# Patient Record
Sex: Female | Born: 1995 | Race: White | Hispanic: No | Smoking: Never smoker
Health system: Southern US, Community
[De-identification: ages and names within clinical notes are randomized; demographics above are authoritative.]

## PROBLEM LIST (undated history)

## (undated) ENCOUNTER — Emergency Department (HOSPITAL_COMMUNITY): Payer: Self-pay | Source: Home / Self Care

## (undated) ENCOUNTER — Ambulatory Visit (HOSPITAL_COMMUNITY): Disposition: A | Discharge status: Home / Self Care | Payer: BLUE CROSS/BLUE SHIELD

## (undated) DIAGNOSIS — E282 Polycystic ovarian syndrome: Secondary | ICD-10-CM

## (undated) DIAGNOSIS — T7840XA Allergy, unspecified, initial encounter: Secondary | ICD-10-CM

## (undated) DIAGNOSIS — Z789 Other specified health status: Secondary | ICD-10-CM

## (undated) DIAGNOSIS — B977 Papillomavirus as the cause of diseases classified elsewhere: Secondary | ICD-10-CM

## (undated) HISTORY — PX: NO PAST SURGERIES: SHX2092

## (undated) HISTORY — DX: Polycystic ovarian syndrome: E28.2

## (undated) HISTORY — DX: Papillomavirus as the cause of diseases classified elsewhere: B97.7

## (undated) HISTORY — DX: Allergy, unspecified, initial encounter: T78.40XA

## (undated) HISTORY — PX: CERVICAL BIOPSY: SHX590

---
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
## 2016-01-31 ENCOUNTER — Inpatient Hospital Stay (HOSPITAL_COMMUNITY): Admission: AD | Admit: 2016-01-31 | Payer: Self-pay | Source: Ambulatory Visit | Admitting: Obstetrics and Gynecology

## 2017-04-10 ENCOUNTER — Ambulatory Visit (HOSPITAL_COMMUNITY)
Admission: EM | Admit: 2017-04-10 | Discharge: 2017-04-10 | Disposition: A | Discharge status: Home / Self Care | Payer: BLUE CROSS/BLUE SHIELD | Attending: Emergency Medicine | Admitting: Emergency Medicine

## 2017-04-10 ENCOUNTER — Encounter (HOSPITAL_COMMUNITY): Payer: Self-pay | Admitting: *Deleted

## 2017-04-10 DIAGNOSIS — E282 Polycystic ovarian syndrome: Secondary | ICD-10-CM | POA: Insufficient documentation

## 2017-04-10 DIAGNOSIS — A630 Anogenital (venereal) warts: Secondary | ICD-10-CM | POA: Insufficient documentation

## 2017-04-10 DIAGNOSIS — J00 Acute nasopharyngitis [common cold]: Secondary | ICD-10-CM | POA: Diagnosis not present

## 2017-04-10 DIAGNOSIS — R509 Fever, unspecified: Secondary | ICD-10-CM | POA: Diagnosis present

## 2017-04-10 DIAGNOSIS — J029 Acute pharyngitis, unspecified: Secondary | ICD-10-CM | POA: Diagnosis present

## 2017-04-10 DIAGNOSIS — R0981 Nasal congestion: Secondary | ICD-10-CM | POA: Diagnosis present

## 2017-04-10 LAB — POCT RAPID STREP A: Streptococcus, Group A Screen (Direct): NEGATIVE

## 2017-04-10 MED ORDER — PHENOL 1.4 % MT LIQD: 1.0000 | OROMUCOSAL | 0 refills | Status: DC | PRN

## 2017-04-10 MED ORDER — FLUTICASONE PROPIONATE 50 MCG/ACT NA SUSP: 2.0000 | Freq: Every day | NASAL | 0 refills | Status: DC

## 2017-04-10 NOTE — ED Provider Notes (Signed)
[facility]    CSN: [id number] Arrival date & time: 04/10/17  1129     History   Chief Complaint Chief Complaint  Patient presents with  . Sore Throat  . Fever  . Nasal Congestion    HPI Kayla Palmer is a 21 y.o. female.   21 year old female comes in for sore throat and nasal congestion. She states she had one episode of similar symptoms that lasted for the past week, which cleared up for 2 days, recent episode started yesterday. She has had subjective fever, with chills. No nasal drainage with productive cough. Denies ear pain, eye pain. Patient states she has seasonal allergies, and takes Claritin-D on and off, which she has started daily since the onset of symptoms. She has taken ibuprofen with good relief. Feels like her throat is swelling, denies trouble breathing, trouble swallowing, shortness of breath, chest pain. Denies sick contact.      Past Medical History:  Diagnosis Date  . Allergy   . HPV (human papilloma virus) infection   . PCOS (polycystic ovarian syndrome)     There are no active problems to display for this patient.   Past Surgical History:  Procedure Laterality Date  . CERVICAL BIOPSY      OB History    No data available       Home Medications    Prior to Admission medications   Medication Sig Start Date End Date Taking? Authorizing Provider  levonorgestrel (MIRENA) 20 MCG/24HR IUD 1 each by Intrauterine route once.   Yes [provider]  ondansetron (ZOFRAN-ODT) 8 MG disintegrating tablet Take 1 tablet (8 mg total) by mouth every 8 (eight) hours as needed for nausea. 04/18/15  Yes Carmelina DaneAnderson, Jeffery S, MD  desloratadine-pseudoephedrine (CLARINEX-D 12-HOUR) 2.5-120 MG per tablet Take 1 tablet by mouth 2 (two) times daily.    [provider]  fluticasone (FLONASE) 50 MCG/ACT nasal spray Place 2 sprays into both nostrils daily. 04/10/17   Cathie HoopsYu, Dreonna Hussein V, PA-C  phenol (CHLORASEPTIC) 1.4 % LIQD Use as directed 1 spray in  the mouth or throat as needed for throat irritation / pain. 04/10/17   Belinda FisherYu, [place] Frieden V, PA-C    Family History Family History  Problem Relation Age of Onset  . Hyperlipidemia Mother   . Hyperlipidemia Father   . Hyperlipidemia Sister   . Hyperlipidemia Maternal Grandmother   . Stroke Maternal Grandmother   . Mental illness Maternal Grandmother   . Hyperlipidemia Maternal Grandfather     Social History Social History  Substance Use Topics  . Smoking status: Never Smoker  . Smokeless tobacco: Never Used  . Alcohol use No     Allergies   Other   Review of Systems Review of Systems  Reason unable to perform ROS: See HPI as above.     Physical Exam Triage Vital Signs ED Triage Vitals  Enc Vitals Group     BP 04/10/17 1210 110/75     Pulse Rate 04/10/17 1210 (!) 104     Resp 04/10/17 1210 16     Temp 04/10/17 1210 99.6 F (37.6 C)     Temp Source 04/10/17 1210 Oral     SpO2 04/10/17 1210 100 %     Weight --      Height --      Head Circumference --      Peak Flow --      Pain Score 04/10/17 1208 8     Pain Loc --  Pain Edu? --      Excl. in GC? --    No data found.   Updated Vital Signs BP 110/75 (BP Location: Left Arm)   Pulse (!) 104   Temp 99.6 F (37.6 C) (Oral)   Resp 16   SpO2 100%   Physical Exam  Constitutional: She is oriented to person, place, and time. She appears well-developed and well-nourished. No distress.  HENT:  Head: Normocephalic and atraumatic.  Right Ear: External ear normal.  Left Ear: Tympanic membrane, external ear and ear canal normal. Tympanic membrane is not erythematous and not bulging.  Nose: Mucosal edema and rhinorrhea present. Right sinus exhibits no maxillary sinus tenderness and no frontal sinus tenderness. Left sinus exhibits no maxillary sinus tenderness and no frontal sinus tenderness.  Mouth/Throat: Uvula is midline and mucous membranes are normal. Posterior oropharyngeal erythema present. Tonsils are 1+ on the  right. Tonsils are 1+ on the left. No tonsillar exudate.  Right ear cerumen impaction, TM not visible.   Eyes: Pupils are equal, round, and reactive to light. Conjunctivae are normal.  Neck: Normal range of motion. Neck supple.  Cardiovascular: Normal rate, regular rhythm and normal heart sounds.  Exam reveals no gallop and no friction rub.   No murmur heard. Pulmonary/Chest: Effort normal and breath sounds normal. She has no decreased breath sounds. She has no wheezes. She has no rhonchi. She has no rales.  Lymphadenopathy:    She has no cervical adenopathy.  Neurological: She is alert and oriented to person, place, and time.  Skin: Skin is warm and dry.  Psychiatric: She has a normal mood and affect. Her behavior is normal. Judgment normal.     UC Treatments / Results  Labs (all labs ordered are listed, but only abnormal results are displayed) Labs Reviewed  CULTURE, GROUP A STREP [facility])  POCT RAPID STREP A    EKG  EKG Interpretation None       Radiology No results found.  Procedures Procedures (including critical care time)  Medications Ordered in UC Medications - No data to display   Initial Impression / Assessment and Plan / UC Course  I have reviewed the triage vital signs and the nursing notes.  Pertinent labs & imaging results that were available during my care of the patient were reviewed by me and considered in my medical decision making (see chart for details).    Rapid strep negative. Symptomatic treatment as needed. Push fluids. Return precautions given.    Final Clinical Impressions(s) / UC Diagnoses   Final diagnoses:  Acute nasopharyngitis    New Prescriptions Discharge Medication List as of 04/10/2017 12:39 PM    START taking these medications   Details  fluticasone (FLONASE) 50 MCG/ACT nasal spray Place 2 sprays into both nostrils daily., Starting Fri 04/10/2017, Normal    phenol (CHLORASEPTIC) 1.4 % LIQD Use as directed 1 spray in the mouth  or throat as needed for throat irritation / pain., Starting Fri 04/10/2017, Normal          Orenthal Debski V, PA-C 04/10/17 1521

## 2017-04-10 NOTE — Discharge Instructions (Signed)
Rapid strep negative. Symptoms are most likely due to viral illness. Start phenol for sore throat. Flonase and/or Claritin-D for nasal congestion. You can use over the counter nasal saline rinse such as neti pot for nasal congestion. Keep hydrated, you urine should be clear to pale yellow in color. Monitor for any worsening of symptoms, swelling of the throat, trouble breathing, trouble swallowing, follow up for reevaluation.

## 2017-04-10 NOTE — ED Triage Notes (Signed)
Patient reports sore throat, congestion, and nasal drainage x 1 week. Reports intermittent fevers.

## 2017-04-11 ENCOUNTER — Telehealth (HOSPITAL_COMMUNITY): Payer: Self-pay | Admitting: Internal Medicine

## 2017-04-11 LAB — CULTURE, GROUP A STREP (THRC)

## 2017-04-11 MED ORDER — PENICILLIN V POTASSIUM 500 MG PO TABS: 500.0000 mg | ORAL_TABLET | Freq: Two times a day (BID) | ORAL | 0 refills | Status: DC

## 2017-04-11 NOTE — Telephone Encounter (Signed)
Clinical staff, please let patient know that throat culture was positive for non group A Strep.  Rx penicillin V sent to the pharmacy of record, [facility] on [address].  Recheck for further evaluation if symptoms are not improving.  LM

## 2017-04-30 ENCOUNTER — Ambulatory Visit (HOSPITAL_COMMUNITY)
Admission: EM | Admit: 2017-04-30 | Discharge: 2017-04-30 | Disposition: A | Discharge status: Home / Self Care | Payer: BLUE CROSS/BLUE SHIELD | Attending: Family Medicine | Admitting: Family Medicine

## 2017-04-30 ENCOUNTER — Encounter (HOSPITAL_COMMUNITY): Payer: Self-pay | Admitting: Emergency Medicine

## 2017-04-30 DIAGNOSIS — K2900 Acute gastritis without bleeding: Secondary | ICD-10-CM | POA: Diagnosis not present

## 2017-04-30 DIAGNOSIS — I951 Orthostatic hypotension: Secondary | ICD-10-CM | POA: Diagnosis not present

## 2017-04-30 DIAGNOSIS — E860 Dehydration: Secondary | ICD-10-CM | POA: Diagnosis not present

## 2017-04-30 DIAGNOSIS — R112 Nausea with vomiting, unspecified: Secondary | ICD-10-CM | POA: Diagnosis not present

## 2017-04-30 MED ORDER — SODIUM CHLORIDE 0.9 % IV SOLN
Freq: Once | INTRAVENOUS | Status: AC
  Administered 2017-04-30: 17:00:00 via INTRAVENOUS

## 2017-04-30 MED ORDER — ONDANSETRON HCL 4 MG/2ML IJ SOLN
INTRAMUSCULAR | Status: AC
  Filled 2017-04-30: qty 2

## 2017-04-30 MED ORDER — ONDANSETRON 4 MG PO TBDP: ORAL_TABLET | ORAL | 0 refills | Status: DC

## 2017-04-30 MED ORDER — ONDANSETRON HCL 4 MG/2ML IJ SOLN
4.0000 mg | Freq: Once | INTRAMUSCULAR | Status: AC
  Administered 2017-04-30: 4 mg via INTRAVENOUS

## 2017-04-30 MED ORDER — ONDANSETRON HCL 4 MG/2ML IJ SOLN: 8.0000 mg | Freq: Once | INTRAMUSCULAR | Status: DC

## 2017-04-30 NOTE — Discharge Instructions (Signed)
Take Zofran sublingually as directed. Start drinking clear liquids in small frequent amounts. consider using Pepcid to help reduce stomach acid. This will take a couple of days or so for you to get back on your feet. If she continued to have vomiting which is unrelieved or develop diarrhea as well plus fever and getting worse he may need to go to the emergency department.

## 2017-04-30 NOTE — ED Triage Notes (Addendum)
Pt woke up at 0800 this morning and began projectile vomiting.  She reports body aches and chills and vomiting all day.  She has not been able to keep anything down including fluids all day today.    She states she is vomiting so hard that she is "fading in and out".  Pt tried Zofran and Ibuprofen this morning and states she vomited up all of her medications.  Pt was at a club last night until 0300 where she states she had only one shot of liquor and smoked marijuana.  She also states she ate at [employer] on her way home last night.
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
## 2017-05-04 ENCOUNTER — Encounter: Payer: Self-pay | Admitting: Internal Medicine

## 2017-05-11 ENCOUNTER — Emergency Department (HOSPITAL_COMMUNITY): Payer: BLUE CROSS/BLUE SHIELD

## 2017-05-11 ENCOUNTER — Emergency Department (HOSPITAL_COMMUNITY)
Admission: EM | Admit: 2017-05-11 | Discharge: 2017-05-12 | Disposition: A | Discharge status: Home / Self Care | Payer: BLUE CROSS/BLUE SHIELD | Attending: Emergency Medicine | Admitting: Emergency Medicine

## 2017-05-11 ENCOUNTER — Encounter (HOSPITAL_COMMUNITY): Payer: Self-pay | Admitting: Emergency Medicine

## 2017-05-11 DIAGNOSIS — R079 Chest pain, unspecified: Secondary | ICD-10-CM | POA: Diagnosis present

## 2017-05-11 DIAGNOSIS — Z5321 Procedure and treatment not carried out due to patient leaving prior to being seen by health care provider: Secondary | ICD-10-CM | POA: Insufficient documentation

## 2017-05-11 LAB — BASIC METABOLIC PANEL
Anion gap: 8 (ref 5–15)
BUN: 17 mg/dL (ref 6–20)
CALCIUM: 9.4 mg/dL (ref 8.9–10.3)
CO2: 25 mmol/L (ref 22–32)
CREATININE: 0.68 mg/dL (ref 0.44–1.00)
Chloride: 106 mmol/L (ref 101–111)
GFR calc non Af Amer: >60 mL/min (ref >60)
GLUCOSE: 95 mg/dL (ref 65–99)
Potassium: 3.5 mmol/L (ref 3.5–5.1)
Sodium: 139 mmol/L (ref 135–145)

## 2017-05-11 LAB — CBC
HCT: 39.9 % (ref 36.0–46.0)
Hemoglobin: 13.6 g/dL (ref 12.0–15.0)
MCH: 29.3 pg (ref 26.0–34.0)
MCHC: 34.1 g/dL (ref 30.0–36.0)
MCV: 86 fL (ref 78.0–100.0)
PLATELETS: 226 10*3/uL (ref 150–400)
RBC: 4.64 MIL/uL (ref 3.87–5.11)
RDW: 13 % (ref 11.5–15.5)
WBC: 11.3 10*3/uL — ABNORMAL HIGH (ref 4.0–10.5)

## 2017-05-11 LAB — I-STAT BETA HCG BLOOD, ED (MC, WL, AP ONLY): I-stat hCG, quantitative: <5 m[IU]/mL (ref <5)

## 2017-05-11 LAB — I-STAT TROPONIN, ED: TROPONIN I, POC: 0.01 ng/mL (ref 0.00–0.08)
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
## 2017-05-12 MED ORDER — IBUPROFEN 400 MG PO TABS
ORAL_TABLET | ORAL | Status: DC
Start: 2017-05-12 — End: 2017-05-12
  Filled 2017-05-12: qty 1

## 2017-05-12 MED ORDER — IBUPROFEN 400 MG PO TABS
400.0000 mg | ORAL_TABLET | Freq: Once | ORAL | Status: AC | PRN
  Administered 2017-05-12: 400 mg via ORAL

## 2017-05-12 NOTE — ED Notes (Signed)
Pt up to nurse first stating she cannot wait any longer, ambulatory out of department.

## 2017-05-15 ENCOUNTER — Ambulatory Visit (HOSPITAL_COMMUNITY)
Admission: EM | Admit: 2017-05-15 | Discharge: 2017-05-15 | Disposition: A | Discharge status: Home / Self Care | Payer: BLUE CROSS/BLUE SHIELD | Attending: Family Medicine | Admitting: Family Medicine

## 2017-05-15 ENCOUNTER — Ambulatory Visit (INDEPENDENT_AMBULATORY_CARE_PROVIDER_SITE_OTHER): Payer: Self-pay

## 2017-05-15 ENCOUNTER — Encounter (HOSPITAL_COMMUNITY): Payer: Self-pay

## 2017-05-15 DIAGNOSIS — S20219A Contusion of unspecified front wall of thorax, initial encounter: Secondary | ICD-10-CM | POA: Diagnosis not present

## 2017-05-15 DIAGNOSIS — S39012A Strain of muscle, fascia and tendon of lower back, initial encounter: Secondary | ICD-10-CM | POA: Diagnosis not present

## 2017-05-15 MED ORDER — NAPROXEN 375 MG PO TABS: 375.0000 mg | ORAL_TABLET | Freq: Two times a day (BID) | ORAL | 0 refills | Status: DC

## 2017-05-15 NOTE — Discharge Instructions (Signed)
Ice to the front of the chest off and on for the next with 3 days. Heat to the low back. Take the anti-inflammatory medicine naproxen twice a day with food. He will be sore for the next several days. Should gradually get better. Limit the movements that tend to make it worse. X-rays negative for fractures or other abnormalities.
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
## 2017-06-10 ENCOUNTER — Ambulatory Visit: Payer: BLUE CROSS/BLUE SHIELD | Admitting: Internal Medicine

## 2017-06-21 ENCOUNTER — Ambulatory Visit (HOSPITAL_COMMUNITY)
Admission: EM | Admit: 2017-06-21 | Discharge: 2017-06-21 | Disposition: A | Discharge status: Home / Self Care | Payer: BLUE CROSS/BLUE SHIELD | Attending: Family Medicine | Admitting: Family Medicine

## 2017-06-21 ENCOUNTER — Encounter (HOSPITAL_COMMUNITY): Payer: Self-pay | Admitting: *Deleted

## 2017-06-21 DIAGNOSIS — R69 Illness, unspecified: Secondary | ICD-10-CM | POA: Diagnosis not present

## 2017-06-21 DIAGNOSIS — J111 Influenza due to unidentified influenza virus with other respiratory manifestations: Secondary | ICD-10-CM

## 2017-06-21 MED ORDER — ACETAMINOPHEN 325 MG PO TABS
ORAL_TABLET | ORAL | Status: AC
  Filled 2017-06-21: qty 2

## 2017-06-21 MED ORDER — ACETAMINOPHEN 325 MG PO TABS
650.0000 mg | ORAL_TABLET | Freq: Once | ORAL | Status: AC
  Administered 2017-06-21: 650 mg via ORAL

## 2017-06-21 MED ORDER — OSELTAMIVIR PHOSPHATE 75 MG PO CAPS: 75.0000 mg | ORAL_CAPSULE | Freq: Two times a day (BID) | ORAL | 0 refills | Status: DC

## 2017-06-21 MED ORDER — HYDROCODONE-HOMATROPINE 5-1.5 MG/5ML PO SYRP: 5.0000 mL | ORAL_SOLUTION | Freq: Four times a day (QID) | ORAL | 0 refills | Status: DC | PRN

## 2017-06-21 NOTE — ED Provider Notes (Signed)
[facility]   [id number] 06/21/17 Arrival Time: 1210   SUBJECTIVE:  Loralyn FreshwaterFrancesca Boda is a 21 y.o. female who presents to the urgent care with complaint of not feeling well since 2 days ago; symptoms started yesterday with congestion, "a lot of mucus sitting in my throat making me nauseous", chills.  Has not taken any meds today.  Patient has remote h/o asthma as child  She works in a bar as a Production assistant, radioserver.   Past Medical History:  Diagnosis Date  . Allergy   . HPV (human papilloma virus) infection   . PCOS (polycystic ovarian syndrome)    Family History  Problem Relation Age of Onset  . Hyperlipidemia Mother   . Hyperlipidemia Father   . Hyperlipidemia Sister   . Hyperlipidemia Maternal Grandmother   . Stroke Maternal Grandmother   . Mental illness Maternal Grandmother   . Hyperlipidemia Maternal Grandfather    Social History   Socioeconomic History  . Marital status: Single    Spouse name: Not on file  . Number of children: Not on file  . Years of education: Not on file  . Highest education level: Not on file  Social Needs  . Financial resource strain: Not on file  . Food insecurity - worry: Not on file  . Food insecurity - inability: Not on file  . Transportation needs - medical: Not on file  . Transportation needs - non-medical: Not on file  Occupational History  . Not on file  Tobacco Use  . Smoking status: Never Smoker  . Smokeless tobacco: Never Used  Substance and Sexual Activity  . Alcohol use: No    Frequency: Never  . Drug use: Yes    Types: Marijuana  . Sexual activity: Not on file  Other Topics Concern  . Not on file  Social History Narrative  . Not on file   Current Meds  Medication Sig  . fluticasone (FLONASE) 50 MCG/ACT nasal spray Place 2 sprays into both nostrils daily.  Marland Kitchen. levonorgestrel (MIRENA) 20 MCG/24HR IUD 1 each by Intrauterine route once.  . sertraline (ZOLOFT) 25 MG tablet Take 25 mg by mouth daily.  . [DISCONTINUED]  desloratadine-pseudoephedrine (CLARINEX-D 12-HOUR) 2.5-120 MG per tablet Take 1 tablet by mouth 2 (two) times daily.  . [DISCONTINUED] naproxen (NAPROSYN) 375 MG tablet Take 1 tablet (375 mg total) by mouth 2 (two) times daily.  . [DISCONTINUED] ondansetron (ZOFRAN ODT) 4 MG disintegrating tablet 1 tablet under the tongue every 6-8 hours as needed for nausea or vomiting   Allergies  Allergen Reactions  . Other     Cats, grass, cherries       ROS: As per HPI, remainder of ROS negative.   OBJECTIVE:   Vitals:   06/21/17 1302  BP: 125/83  Pulse: (!) 105  Resp: 20  Temp: (!) 101.6 F (38.7 C)  TempSrc: Oral  SpO2: 95%     General appearance: alert; no distress Eyes: PERRL; EOMI; conjunctiva normal HENT: normocephalic; atraumatic; TMs normal, canal normal, external ears normal without trauma; nasal mucosa normal; oral mucosa normal Neck: supple Lungs: clear to auscultation bilaterally Heart: regular rate and rhythm Abdomen: soft, non-tender; bowel sounds normal; no masses or organomegaly; no guarding or rebound tenderness Back: no CVA tenderness Extremities: no cyanosis or edema; symmetrical with no gross deformities Skin: warm and dry Neurologic: normal gait; grossly normal Psychological: alert and cooperative; normal mood and affect      Labs:  Results for orders placed or performed during the  hospital encounter of 04/10/17  Culture, group A strep  Result Value Ref Range   Specimen Description THROAT    Special Requests NONE    Culture MODERATE STREPTOCOCCUS,BETA HEMOLYTIC NOT GROUP A    Report Status 04/11/2017 FINAL   POCT rapid strep A [facility]  Result Value Ref Range   Streptococcus, Group A Screen (Direct) NEGATIVE NEGATIVE    Labs Reviewed - No data to display  No results found.     ASSESSMENT & PLAN:  1. Influenza-like illness     Meds ordered this encounter  Medications  . acetaminophen (TYLENOL) tablet 650 mg  . oseltamivir  (TAMIFLU) 75 MG capsule    Sig: Take 1 capsule (75 mg total) every 12 (twelve) hours by mouth.    Dispense:  10 capsule    Refill:  0  . HYDROcodone-homatropine (HYDROMET) 5-1.5 MG/5ML syrup    Sig: Take 5 mLs every 6 (six) hours as needed by mouth for cough.    Dispense:  60 mL    Refill:  0    Reviewed expectations re: course of current medical issues. Questions answered. Outlined signs and symptoms indicating need for more acute intervention. Patient verbalized understanding. After Visit Summary given.       Elvina SidleLauenstein, Cressida Milford, MD 06/21/17 1321

## 2017-06-21 NOTE — Discharge Instructions (Signed)
Return if symptoms worsen or fail to improve over next 3 days

## 2017-06-21 NOTE — ED Triage Notes (Signed)
C/O not feeling well 2 days ago; started yesterday with congestion, "a lot of mucus sitting in my throat making me nauseous", chills.  Has not taken any meds today.

## 2017-06-23 ENCOUNTER — Emergency Department (HOSPITAL_COMMUNITY)
Admission: EM | Admit: 2017-06-23 | Discharge: 2017-06-24 | Disposition: A | Discharge status: Home / Self Care | Payer: BLUE CROSS/BLUE SHIELD | Attending: Emergency Medicine | Admitting: Emergency Medicine

## 2017-06-23 ENCOUNTER — Other Ambulatory Visit: Payer: Self-pay

## 2017-06-23 ENCOUNTER — Encounter (HOSPITAL_COMMUNITY): Payer: Self-pay | Admitting: Emergency Medicine

## 2017-06-23 DIAGNOSIS — B349 Viral infection, unspecified: Secondary | ICD-10-CM | POA: Diagnosis not present

## 2017-06-23 DIAGNOSIS — E860 Dehydration: Secondary | ICD-10-CM

## 2017-06-23 DIAGNOSIS — R112 Nausea with vomiting, unspecified: Secondary | ICD-10-CM | POA: Diagnosis present

## 2017-06-23 DIAGNOSIS — Z79899 Other long term (current) drug therapy: Secondary | ICD-10-CM | POA: Insufficient documentation

## 2017-06-23 LAB — URINALYSIS, ROUTINE W REFLEX MICROSCOPIC
BILIRUBIN URINE: NEGATIVE
Glucose, UA: NEGATIVE mg/dL
KETONES UR: 80 mg/dL — AB
LEUKOCYTES UA: NEGATIVE
NITRITE: NEGATIVE
PH: 6 (ref 5.0–8.0)
Protein, ur: NEGATIVE mg/dL
SPECIFIC GRAVITY, URINE: 1.031 — AB (ref 1.005–1.030)

## 2017-06-23 LAB — CBC
HEMATOCRIT: 43.7 % (ref 36.0–46.0)
Hemoglobin: 14.9 g/dL (ref 12.0–15.0)
MCH: 30.2 pg (ref 26.0–34.0)
MCHC: 34.1 g/dL (ref 30.0–36.0)
MCV: 88.6 fL (ref 78.0–100.0)
Platelets: 133 10*3/uL — ABNORMAL LOW (ref 150–400)
RBC: 4.93 MIL/uL (ref 3.87–5.11)
RDW: 13.1 % (ref 11.5–15.5)
WBC: 4.9 10*3/uL (ref 4.0–10.5)

## 2017-06-23 LAB — HEPATIC FUNCTION PANEL
ALBUMIN: 4.2 g/dL (ref 3.5–5.0)
ALK PHOS: 56 U/L (ref 38–126)
ALT: 63 U/L — ABNORMAL HIGH (ref 14–54)
AST: 44 U/L — ABNORMAL HIGH (ref 15–41)
BILIRUBIN TOTAL: 0.5 mg/dL (ref 0.3–1.2)
Bilirubin, Direct: <0.1 mg/dL — ABNORMAL LOW (ref 0.1–0.5)
TOTAL PROTEIN: 7 g/dL (ref 6.5–8.1)

## 2017-06-23 LAB — LIPASE, BLOOD: LIPASE: 17 U/L (ref 11–51)

## 2017-06-23 LAB — HCG, QUANTITATIVE, PREGNANCY

## 2017-06-23 LAB — BASIC METABOLIC PANEL
ANION GAP: 9 (ref 5–15)
BUN: 14 mg/dL (ref 6–20)
CHLORIDE: 106 mmol/L (ref 101–111)
CO2: 22 mmol/L (ref 22–32)
Calcium: 9 mg/dL (ref 8.9–10.3)
Creatinine, Ser: 0.7 mg/dL (ref 0.44–1.00)
Glucose, Bld: 91 mg/dL (ref 65–99)
POTASSIUM: 3.9 mmol/L (ref 3.5–5.1)
Sodium: 137 mmol/L (ref 135–145)

## 2017-06-23 MED ORDER — ONDANSETRON HCL 4 MG/2ML IJ SOLN
4.0000 mg | Freq: Once | INTRAMUSCULAR | Status: AC
  Administered 2017-06-23: 4 mg via INTRAVENOUS
  Filled 2017-06-23: qty 2

## 2017-06-23 MED ORDER — SODIUM CHLORIDE 0.9 % IV BOLUS (SEPSIS)
1000.0000 mL | Freq: Once | INTRAVENOUS | Status: AC
  Administered 2017-06-23: 1000 mL via INTRAVENOUS

## 2017-06-23 NOTE — ED Provider Notes (Signed)
[facility] Provider Note   CSN: [id number] Arrival date & time: 06/23/17  1732     History   Chief Complaint Chief Complaint  Patient presents with  . Loss of Consciousness    HPI Kayla Palmer is a 21 y.o. female who presents with nausea and vomiting.  The patient states that she developed a cough, fatigue and was seen at an urgent care.  She was treated prophylactically with Tamiflu.  Patient states that she has had flu in the and been treated with Tamiflu in the past.  She said that this is unlike influenza because she had no body aches.  She has been taking the Tamiflu which normally causes her to become nauseous and have vomiting was trying to treat that with Zofran.  Patient states that over the past 2 days she has had worsening nausea, vomiting to the point where she could not keep down any medications or liquids.  She feels weak and lightheaded with standing.  She continued to have some nausea.  She denies fever, chills, body aches, diarrhea, constipation, history of abdominal surgeries. HPI  Past Medical History:  Diagnosis Date  . Allergy   . HPV (human papilloma virus) infection   . PCOS (polycystic ovarian syndrome)     There are no active problems to display for this patient.   Past Surgical History:  Procedure Laterality Date  . CERVICAL BIOPSY      OB History    No data available       Home Medications    Prior to Admission medications   Medication Sig Start Date End Date Taking? Authorizing Provider  fluticasone (FLONASE) 50 MCG/ACT nasal spray Place 2 sprays into both nostrils daily. 04/10/17   Cathie HoopsYu, Amy V, PA-C  HYDROcodone-homatropine (HYDROMET) 5-1.5 MG/5ML syrup Take 5 mLs every 6 (six) hours as needed by mouth for cough. 06/21/17   Elvina SidleLauenstein, Kurt, MD  levonorgestrel (MIRENA) 20 MCG/24HR IUD 1 each by Intrauterine route once.    [provider]  oseltamivir (TAMIFLU) 75 MG capsule Take 1 capsule (75  mg total) every 12 (twelve) hours by mouth. 06/21/17   Elvina SidleLauenstein, Kurt, MD  sertraline (ZOLOFT) 25 MG tablet Take 25 mg by mouth daily.    [provider]    Family History Family History  Problem Relation Age of Onset  . Hyperlipidemia Mother   . Hyperlipidemia Father   . Hyperlipidemia Sister   . Hyperlipidemia Maternal Grandmother   . Stroke Maternal Grandmother   . Mental illness Maternal Grandmother   . Hyperlipidemia Maternal Grandfather     Social History Social History   Tobacco Use  . Smoking status: Never Smoker  . Smokeless tobacco: Never Used  Substance Use Topics  . Alcohol use: No    Frequency: Never  . Drug use: Yes    Types: Marijuana     Allergies   Other   Review of Systems Review of Systems  Ten systems reviewed and are negative for acute change, except as noted in the HPI.   Physical Exam Updated Vital Signs BP 120/75   Pulse 99   Temp 99.9 F (37.7 C)   Resp 16   Ht 5\' 4"  (1.626 m)   Wt 60.3 kg (133 lb)   SpO2 99%   BMI 22.83 kg/m   Physical Exam  Constitutional: She is oriented to person, place, and time. She appears well-developed and well-nourished. No distress.  HENT:  Head: Normocephalic and atraumatic.  Eyes:  Conjunctivae are normal. No scleral icterus.  Neck: Normal range of motion.  Cardiovascular: Normal rate, regular rhythm and normal heart sounds. Exam reveals no gallop and no friction rub.  No murmur heard. Pulmonary/Chest: Effort normal and breath sounds normal. No respiratory distress.  Abdominal: Soft. Bowel sounds are normal. She exhibits no distension and no mass. There is no tenderness. There is no guarding.  Neurological: She is alert and oriented to person, place, and time.  Skin: Skin is warm and dry. She is not diaphoretic.  Psychiatric: Her behavior is normal.  Nursing note and vitals reviewed.    ED Treatments / Results  Labs (all labs ordered are listed, but only abnormal results are  displayed) Labs Reviewed  CBC - Abnormal; Notable for the following components:      Result Value   Platelets 133 (*)    All other components within normal limits  URINALYSIS, ROUTINE W REFLEX MICROSCOPIC - Abnormal; Notable for the following components:   APPearance HAZY (*)    Specific Gravity, Urine 1.031 (*)    Hgb urine dipstick SMALL (*)    Ketones, ur 80 (*)    Bacteria, UA FEW (*)    Squamous Epithelial / LPF 6-30 (*)    All other components within normal limits  HEPATIC FUNCTION PANEL - Abnormal; Notable for the following components:   AST 44 (*)    ALT 63 (*)    Bilirubin, Direct <0.1 (*)    All other components within normal limits  BASIC METABOLIC PANEL  LIPASE, BLOOD  HCG, QUANTITATIVE, PREGNANCY    EKG  EKG Interpretation  Date/Time:  Tuesday June 23 2017 18:02:27 EST Ventricular Rate:  124 PR Interval:  124 QRS Duration: 72 QT Interval:  294 QTC Calculation: 422 R Axis:   89 Text Interpretation:  Sinus tachycardia Right atrial enlargement Nonspecific T wave abnormality Abnormal ECG When compared with ECG of 05/11/2017, HEART RATE has increased Nonspecific T wave abnormality is now present - possibly rate-related Confirmed by Dione Booze ([id number]) on 06/23/2017 6:07:17 PM       Radiology No results found.  Procedures Procedures (including critical care time)  Medications Ordered in ED Medications  sodium chloride 0.9 % bolus 1,000 mL (1,000 mLs Intravenous New Bag/Given 06/23/17 2140)  ondansetron (ZOFRAN) injection 4 mg (4 mg Intravenous Given 06/23/17 2140)     Initial Impression / Assessment and Plan / ED Course  I have reviewed the triage vital signs and the nursing notes.  Pertinent labs & imaging results that were available during my care of the patient were reviewed by me and considered in my medical decision making (see chart for details).     Patient sxs improved. She is tolerating PO fluids. Kayla Palmer is a 21 y.o. female who  presents to ED for nausea and vomiting for 2 days. On exam, patient is afebrile, non-toxic appearing with a non-surgical abdominal exam. IV fluids and nausea medications given. Negative Labs. On repeat examination, patient is now tolerating PO fluids with no episodes of emesis since medication administration. Repeat abdominal exam benign. Sxs c/w viral etiology. Evaluation does not show pathology that would require ongoing emergent intervention or inpatient treatment. Rx for phenergan given. PCP follow up encouraged. Spoke at length with patient about signs or symptoms that should prompt return to emergency Department including inability to tolerate PO, blood in the stools, fevers, focal localization of abdominal pain, new/worsening symptoms or any additional concerns. Patient understands diagnosis and plan of care as dictated  above. All questions answered.   Final Clinical Impressions(s) / ED Diagnoses   Final diagnoses:  Viral illness  Non-intractable vomiting with nausea, unspecified vomiting type  Dehydration    ED Discharge Orders    None       Arthor CaptainHarris, Jude Naclerio, PA-C 06/24/17 0041    Linwood DibblesKnapp, Jon, MD 06/25/17 [phone]

## 2017-06-23 NOTE — ED Triage Notes (Signed)
Pt to ER for evaluation of 3 days nausea, vomiting, nasal congestion, cough, and scratchy throat. [facility], was given tamiflu but has been unable to keep it down. States began having syncopal episodes with vomiting yesterday. Pt a/o x4. Tachycardic in triage at 120.

## 2017-06-24 MED ORDER — PROMETHAZINE HCL 25 MG PO TABS: 25.0000 mg | ORAL_TABLET | Freq: Four times a day (QID) | ORAL | 0 refills | Status: DC | PRN

## 2017-06-24 NOTE — Discharge Instructions (Signed)
You may stop taking the tamiflu. Continue frequent small sips (10-20 ml) of clear liquids every 5-10 minutes. Gatorade or powerade are good options. Avoid milk, orange juice, and grape juice for now. . Once you have not had further vomiting with the small sips for 4 hours, you may begin to drink larger volumes of fluids at a time and try a bland diet which may include saltine crackers, applesauce, breads, pastas, bananas, bland chicken. If you continues to vomit despite medication, return to the ED for repeat evaluation. Otherwise, follow up with your doctor in 2-3 days for a re-check.

## 2017-08-07 ENCOUNTER — Ambulatory Visit (HOSPITAL_COMMUNITY)
Admission: EM | Admit: 2017-08-07 | Discharge: 2017-08-07 | Disposition: A | Discharge status: Home / Self Care | Payer: BLUE CROSS/BLUE SHIELD

## 2017-08-13 ENCOUNTER — Encounter (HOSPITAL_COMMUNITY): Payer: Self-pay | Admitting: Emergency Medicine

## 2017-08-13 ENCOUNTER — Ambulatory Visit (HOSPITAL_COMMUNITY)
Admission: EM | Admit: 2017-08-13 | Discharge: 2017-08-13 | Disposition: A | Discharge status: Home / Self Care | Payer: BLUE CROSS/BLUE SHIELD | Attending: Family Medicine | Admitting: Family Medicine

## 2017-08-13 ENCOUNTER — Other Ambulatory Visit: Payer: Self-pay

## 2017-08-13 DIAGNOSIS — R110 Nausea: Secondary | ICD-10-CM

## 2017-08-13 DIAGNOSIS — R112 Nausea with vomiting, unspecified: Secondary | ICD-10-CM

## 2017-08-13 MED ORDER — PROMETHAZINE HCL 25 MG PO TABS: 25.0000 mg | ORAL_TABLET | Freq: Four times a day (QID) | ORAL | 0 refills | Status: DC | PRN

## 2017-08-13 NOTE — ED Triage Notes (Signed)
Pt c/o nausea and poor appetite since yesterday morning.

## 2017-08-18 NOTE — ED Provider Notes (Signed)
[facility]   [id number] 08/13/17 Arrival Time: 1848  ASSESSMENT & PLAN:  1. Nausea   2. Nausea and vomiting, intractability of vomiting not specified, unspecified vomiting type     Meds ordered this encounter  Medications  . promethazine (PHENERGAN) 25 MG tablet    Sig: Take 1 tablet (25 mg total) by mouth every 6 (six) hours as needed for nausea or vomiting.    Dispense:  12 tablet    Refill:  0   Suspect viral illness. Will do her best to ensure adequate fluid intake. ED if worsening or unable to tolerate PO fluid intake.  Reviewed expectations re: course of current medical issues. Questions answered. Outlined signs and symptoms indicating need for more acute intervention. Patient verbalized understanding. After Visit Summary given.   SUBJECTIVE:  Kayla Palmer is a 22 y.o. female who presents with complaint of abrupt onset of nausea with sporadic emesis. Started yesterday morning. No worsening. No diarrhea or abdominal pain. Decreased PO intake. Overall fatigued. No urinary symptoms. Tolerating PO fluids. OTC treatment: None. No travel or sick contacts. No specific aggravating or alleviating factors reported.  No LMP recorded. Patient is not currently having periods (Reason: IUD).   Past Surgical History:  Procedure Laterality Date  . CERVICAL BIOPSY      ROS: As per HPI.  OBJECTIVE:  Vitals:   08/13/17 1958  BP: 125/78  Pulse: (!) 115  Resp: 18  Temp: 99.1 F (37.3 C)  SpO2: 97%    General appearance: alert; no distress; appears fatigued Lungs: clear to auscultation bilaterally Heart: tachycardic; regular rhythm Abdomen: soft; non-distended; no tenderness; bowel sounds present; no masses or organomegaly; no guarding or rebound tenderness Back: no CVA tenderness; FROM at hips Extremities: no edema; symmetrical with no gross deformities Skin: warm and dry Psychological: alert and cooperative; normal mood and affect   Allergies  Allergen  Reactions  . Other     Cats, grass, cherries                                                Past Medical History:  Diagnosis Date  . Allergy   . HPV (human papilloma virus) infection   . PCOS (polycystic ovarian syndrome)    Social History   Socioeconomic History  . Marital status: Single    Spouse name: Not on file  . Number of children: Not on file  . Years of education: Not on file  . Highest education level: Not on file  Social Needs  . Financial resource strain: Not on file  . Food insecurity - worry: Not on file  . Food insecurity - inability: Not on file  . Transportation needs - medical: Not on file  . Transportation needs - non-medical: Not on file  Occupational History  . Not on file  Tobacco Use  . Smoking status: Never Smoker  . Smokeless tobacco: Never Used  Substance and Sexual Activity  . Alcohol use: No    Frequency: Never  . Drug use: Yes    Types: Marijuana  . Sexual activity: Not on file  Other Topics Concern  . Not on file  Social History Narrative  . Not on file   Family History  Problem Relation Age of Onset  . Hyperlipidemia Mother   . Hyperlipidemia Father   . Hyperlipidemia Sister   . Hyperlipidemia  Maternal Grandmother   . Stroke Maternal Grandmother   . Mental illness Maternal Grandmother   . Hyperlipidemia Maternal Glynda Jaeger, MD 08/18/17 [phone]

## 2017-09-01 ENCOUNTER — Other Ambulatory Visit: Payer: Self-pay

## 2017-09-01 ENCOUNTER — Encounter (HOSPITAL_COMMUNITY): Payer: Self-pay | Admitting: Emergency Medicine

## 2017-09-01 ENCOUNTER — Ambulatory Visit (HOSPITAL_COMMUNITY)
Admission: EM | Admit: 2017-09-01 | Discharge: 2017-09-01 | Disposition: A | Discharge status: Home / Self Care | Payer: BLUE CROSS/BLUE SHIELD | Attending: Family Medicine | Admitting: Family Medicine

## 2017-09-01 DIAGNOSIS — R110 Nausea: Secondary | ICD-10-CM

## 2017-09-01 MED ORDER — FLUTICASONE PROPIONATE 50 MCG/ACT NA SUSP: 2.0000 | Freq: Every day | NASAL | 0 refills | Status: DC

## 2017-09-01 MED ORDER — PROMETHAZINE HCL 25 MG PO TABS: 25.0000 mg | ORAL_TABLET | Freq: Four times a day (QID) | ORAL | 0 refills | Status: DC | PRN

## 2017-09-01 NOTE — Discharge Instructions (Signed)
Phenergan for nausea and vomiting as needed. Keep hydrated, you urine should be clear to pale yellow in color. Flonase and claritin-D for nasal congestion. Monitor for any worsening of symptoms, nausea or vomiting not controlled by medication, worsening abdominal pain, fever, go to the emergency department for further evaluation.

## 2017-09-01 NOTE — ED Triage Notes (Addendum)
Pt states she has been feeling nauseous, has nasal congestion, no appetite and just feels bad.  Pt was here on the 10th with similar symptoms.  She states when she gets sick she stays sick for a long time.  She has been to us several times since November with similar symptoms.  When asked if she is having any pain today she states she is sore but she went to the gym yesterday so she thinks it is from that.

## 2017-09-01 NOTE — ED Provider Notes (Signed)
[facility]    CSN: [id number] Arrival date & time: 09/01/17  1004     History   Chief Complaint Chief Complaint  Patient presents with  . flu like symptoms    HPI Kayla Palmer is a 22 y.o. female.   22 year old female come sin for 1 day history of nausea, nasal congestion, and generally feeling unwell. States just "feels that I am sick". Denies fever, though with chills. Denies body aches. States without appetite, and has not eaten anything. Denies abdominal pain, vomiting, diarrhea. Has not tried anything for the symptoms. Denies fever, chills, night sweats. Patient states with similar symptoms in the past during viral illnesses. States still has zofran, but felt that it has not been working. Had phenergan last time, states it did help. Never tobacco smoker. Admits to [facility] use, 1-2/week.       Past Medical History:  Diagnosis Date  . Allergy   . HPV (human papilloma virus) infection   . PCOS (polycystic ovarian syndrome)     There are no active problems to display for this patient.   Past Surgical History:  Procedure Laterality Date  . CERVICAL BIOPSY      OB History    No data available       Home Medications    Prior to Admission medications   Medication Sig Start Date End Date Taking? Authorizing Provider  ondansetron (ZOFRAN-ODT) 4 MG disintegrating tablet Take 4 mg by mouth every 8 (eight) hours as needed for nausea or vomiting.   Yes [provider]  fluticasone (FLONASE) 50 MCG/ACT nasal spray Place 2 sprays into both nostrils daily. 09/01/17   Belinda Fisher, PA-C  levonorgestrel (MIRENA) 20 MCG/24HR IUD 1 each by Intrauterine route once.    [provider]  promethazine (PHENERGAN) 25 MG tablet Take 1 tablet (25 mg total) by mouth every 6 (six) hours as needed for nausea or vomiting. 09/01/17   Belinda Fisher, PA-C    Family History Family History  Problem Relation Age of Onset  . Hyperlipidemia Mother   . Hyperlipidemia Father    . Hyperlipidemia Sister   . Hyperlipidemia Maternal Grandmother   . Stroke Maternal Grandmother   . Mental illness Maternal Grandmother   . Hyperlipidemia Maternal Grandfather     Social History Social History   Tobacco Use  . Smoking status: Never Smoker  . Smokeless tobacco: Never Used  Substance Use Topics  . Alcohol use: No    Frequency: Never  . Drug use: Yes    Types: Marijuana     Allergies   Other   Review of Systems Review of Systems  Reason unable to perform ROS: See HPI as above.     Physical Exam Triage Vital Signs ED Triage Vitals  Enc Vitals Group     BP 09/01/17 1051 107/71     Pulse Rate 09/01/17 1051 89     Resp --      Temp 09/01/17 1051 (!) 97.4 F (36.3 C)     Temp Source 09/01/17 1051 Oral     SpO2 09/01/17 1051 96 %     Weight --      Height --      Head Circumference --      Peak Flow --      Pain Score 09/01/17 1049 0     Pain Loc --      Pain Edu? --      Excl. in GC? --  No data found.  Updated Vital Signs BP 107/71 (BP Location: Left Arm)   Pulse 89   Temp (!) 97.4 F (36.3 C) (Oral)   SpO2 96%   Physical Exam  Constitutional: She is oriented to person, place, and time. She appears well-developed and well-nourished. No distress.  HENT:  Head: Normocephalic and atraumatic.  Right Ear: External ear and ear canal normal. Tympanic membrane is erythematous. Tympanic membrane is not bulging.  Left Ear: External ear and ear canal normal. Tympanic membrane is erythematous. Tympanic membrane is not bulging.  Nose: Rhinorrhea present. Right sinus exhibits no maxillary sinus tenderness and no frontal sinus tenderness. Left sinus exhibits no maxillary sinus tenderness and no frontal sinus tenderness.  Mouth/Throat: Uvula is midline, oropharynx is clear and moist and mucous membranes are normal.  Eyes: Conjunctivae are normal. Pupils are equal, round, and reactive to light.  Neck: Normal range of motion. Neck supple.    Cardiovascular: Normal rate, regular rhythm and normal heart sounds. Exam reveals no gallop and no friction rub.  No murmur heard. Pulmonary/Chest: Effort normal and breath sounds normal. She has no decreased breath sounds. She has no wheezes. She has no rhonchi. She has no rales.  Abdominal: Soft. Bowel sounds are normal. She exhibits no distension. There is no tenderness. There is no rebound and no guarding.  Lymphadenopathy:    She has no cervical adenopathy.  Neurological: She is alert and oriented to person, place, and time.  Skin: Skin is warm and dry.  Psychiatric: She has a normal mood and affect. Her behavior is normal. Judgment normal.     UC Treatments / Results  Labs (all labs ordered are listed, but only abnormal results are displayed) Labs Reviewed - No data to display  EKG  EKG Interpretation None       Radiology No results found.  Procedures Procedures (including critical care time)  Medications Ordered in UC Medications - No data to display   Initial Impression / Assessment and Plan / UC Course  I have reviewed the triage vital signs and the nursing notes.  Pertinent labs & imaging results that were available during my care of the patient were reviewed by me and considered in my medical decision making (see chart for details).    No alarming signs on exam. Phenergan for nausea/vomiting. Other symptomatic treatment discussed. Push fluids.  Discussed with patient to [facility] with recurrent nausea episodes. Patient also to follow up with PCP for further evaluation of recurrent nausea. Return precautions given. Patient expresses understanding and agrees to plan.   Final Clinical Impressions(s) / UC Diagnoses   Final diagnoses:  Nausea without vomiting    ED Discharge Orders        Ordered    promethazine (PHENERGAN) 25 MG tablet  Every 6 hours PRN     09/01/17 1107    fluticasone (FLONASE) 50 MCG/ACT nasal spray  Daily     09/01/17 1108          Belinda FisherYu, Amy V, PA-C 09/01/17 1113

## 2018-01-02 ENCOUNTER — Other Ambulatory Visit: Payer: Self-pay

## 2018-01-02 ENCOUNTER — Ambulatory Visit (HOSPITAL_COMMUNITY)
Admission: EM | Admit: 2018-01-02 | Discharge: 2018-01-02 | Disposition: A | Discharge status: Home / Self Care | Payer: BLUE CROSS/BLUE SHIELD | Attending: Internal Medicine | Admitting: Internal Medicine

## 2018-01-02 ENCOUNTER — Encounter (HOSPITAL_COMMUNITY): Payer: Self-pay | Admitting: Emergency Medicine

## 2018-01-02 DIAGNOSIS — H1031 Unspecified acute conjunctivitis, right eye: Secondary | ICD-10-CM

## 2018-01-02 MED ORDER — TETRACAINE HCL 0.5 % OP SOLN
OPHTHALMIC | Status: AC
  Filled 2018-01-02: qty 8

## 2018-01-02 MED ORDER — POLYMYXIN B-TRIMETHOPRIM 10000-0.1 UNIT/ML-% OP SOLN: 1.0000 [drp] | OPHTHALMIC | 0 refills | Status: AC

## 2018-01-02 NOTE — Discharge Instructions (Signed)
Use polytrim eye drops every 4 hours for 7 days  Cool compresses to help with discomfort and crusting  Wash pillow cases/linens  Have good hand hygiene  Return if symptoms not improving, worsening, developing redness and swelling around the eye, changes inn vision, pain.

## 2018-01-02 NOTE — ED Provider Notes (Addendum)
[facility]    CSN: [id number] Arrival date & time: 01/02/18  1219     History   Chief Complaint Chief Complaint  Patient presents with  . Conjunctivitis    HPI Kayla Palmer is a 22 y.o. female history of PCOS presenting today with right eye redness and drainage.  Symptoms have been going on for approximately 5 to 6 days.  Continued to worsen.  Has been using over-the-counter eyedrops without relief.  Endorsing significant drainage and crusting in the morning.  Drainage persists throughout the day.  Has had some itching, denies pain.  Endorses some slight blurry vision related to drainage.  Denies wearing contacts, does wear glasses.  Denies any contacts with similar symptoms but notes that she works at [employer]. HPI  Past Medical History:  Diagnosis Date  . Allergy   . HPV (human papilloma virus) infection   . PCOS (polycystic ovarian syndrome)     There are no active problems to display for this patient.   Past Surgical History:  Procedure Laterality Date  . CERVICAL BIOPSY      OB History   None      Home Medications    Prior to Admission medications   Medication Sig Start Date End Date Taking? Authorizing Provider  escitalopram (LEXAPRO) 5 MG tablet Take 5 mg by mouth daily.   Yes [provider]  levonorgestrel (MIRENA) 20 MCG/24HR IUD 1 each by Intrauterine route once.   Yes [provider]  fluticasone (FLONASE) 50 MCG/ACT nasal spray Place 2 sprays into both nostrils daily. 09/01/17   Cathie Hoops, Amy V, PA-C  trimethoprim-polymyxin b (POLYTRIM) ophthalmic solution Place 1 drop into the right eye every 4 (four) hours for 7 days. 01/02/18 01/09/18  Ceara Wrightson, Junius Creamer, PA-C    Family History Family History  Problem Relation Age of Onset  . Hyperlipidemia Mother   . Hyperlipidemia Father   . Hyperlipidemia Sister   . Hyperlipidemia Maternal Grandmother   . Stroke Maternal Grandmother   . Mental illness Maternal Grandmother   .  Hyperlipidemia Maternal Grandfather     Social History Social History   Tobacco Use  . Smoking status: Never Smoker  . Smokeless tobacco: Never Used  Substance Use Topics  . Alcohol use: No    Frequency: Never  . Drug use: Yes    Types: Marijuana     Allergies   Other   Review of Systems Review of Systems  Constitutional: Negative for activity change, appetite change, chills, fatigue and fever.  HENT: Negative for congestion, ear pain, rhinorrhea, sinus pressure, sore throat and trouble swallowing.   Eyes: Positive for discharge, redness and itching. Negative for photophobia, pain and visual disturbance.  Respiratory: Negative for cough, chest tightness and shortness of breath.   Cardiovascular: Negative for chest pain.  Gastrointestinal: Negative for abdominal pain, nausea and vomiting.  Musculoskeletal: Negative for myalgias.  Skin: Negative for rash.  Neurological: Negative for dizziness, light-headedness and headaches.     Physical Exam Triage Vital Signs ED Triage Vitals [01/02/18 1308]  Enc Vitals Group     BP 107/72     Pulse Rate 84     Resp 18     Temp 98.1 F (36.7 C)     Temp Source Oral     SpO2 97 %     Weight      Height      Head Circumference      Peak Flow      Pain  Score 6     Pain Loc      Pain Edu?      Excl. in GC?    No data found.  Updated Vital Signs BP 107/72 (BP Location: Left Arm)   Pulse 84   Temp 98.1 F (36.7 C) (Oral)   Resp 18   SpO2 97%   Visual Acuity Right Eye Distance:  20/30 Left Eye Distance:  20/25 Bilateral Distance:  20/25  Right Eye Near:   Left Eye Near:    Bilateral Near:     Physical Exam  Constitutional: She appears well-developed and well-nourished. No distress.  HENT:  Head: Normocephalic and atraumatic.  Bilateral ears without tenderness to palpation of external auricle, tragus and mastoid, EAC's without erythema or swelling, TM's with good bony landmarks and cone of light. Non  erythematous.  Oral mucosa pink and moist, no tonsillar enlargement or exudate. Posterior pharynx patent and nonerythematous, no uvula deviation or swelling. Normal phonation.  Eyes: Pupils are equal, round, and reactive to light. Conjunctivae and EOM are normal.  Right conjunctiva erythematous, significant amount of yellowish discharge on upper and lower lashes as well as in medial canthus, on fluorescein staining no abrasion or ulcer visualized.  Neck: Neck supple.  Cardiovascular: Normal rate and regular rhythm.  No murmur heard. Pulmonary/Chest: Effort normal and breath sounds normal. No respiratory distress.  Abdominal: Soft. There is no tenderness.  Musculoskeletal: She exhibits no edema.  Neurological: She is alert.  Skin: Skin is warm and dry.  Psychiatric: She has a normal mood and affect.  Nursing note and vitals reviewed.    UC Treatments / Results  Labs (all labs ordered are listed, but only abnormal results are displayed) Labs Reviewed - No data to display  EKG None  Radiology No results found.  Procedures Procedures (including critical care time)  Medications Ordered in UC Medications - No data to display  Initial Impression / Assessment and Plan / UC Course  I have reviewed the triage vital signs and the nursing notes.  Pertinent labs & imaging results that were available during my care of the patient were reviewed by me and considered in my medical decision making (see chart for details).     Patient with what appears to be a conjunctivitis, likely bacterial given unilateral and thick discharge.  Will treat with Polytrim, cool compresses. Discussed strict return precautions. Patient verbalized understanding and is agreeable with plan.  Final Clinical Impressions(s) / UC Diagnoses   Final diagnoses:  Acute bacterial conjunctivitis of right eye     Discharge Instructions     Use polytrim eye drops every 4 hours for 7 days  Cool compresses to help  with discomfort and crusting  Wash pillow cases/linens  Have good hand hygiene  Return if symptoms not improving, worsening, developing redness and swelling around the eye, changes inn vision, pain.   ED Prescriptions    Medication Sig Dispense Auth. Provider   trimethoprim-polymyxin b (POLYTRIM) ophthalmic solution Place 1 drop into the right eye every 4 (four) hours for 7 days. 10 mL Kenyatta Keidel C, PA-C     Controlled Substance Prescriptions [place] Controlled Substance Registry consulted? Not Applicable   Lew DawesWieters, Marranda Arakelian C, PA-C 01/02/18 1403    Lew DawesWieters, Aliahna Statzer C, [place]PA-C 01/02/18 1403

## 2018-01-02 NOTE — ED Triage Notes (Signed)
[facility] a [facility] of draining, itching and matting of the right eye x 6 days.
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
## 2020-08-04 NOTE — L&D Delivery Note (Signed)
Delivery Note Kayla Palmer is a G1P0 at [redacted]w[redacted]d who had a spontaneous delivery at 61 a viable female was delivered via LOA.  APGAR: 8, 9 ; weight pending .     Admitted for post dates induction of labor. Induced with cytotec, foley, pit. Progressed normally. Received epidural for pain management. Pushed for 90 minutes. Baby was delivered without difficulty. Loose nuchal cord reduced after delivery of fetal head.  Delayed cord clamping for 60 seconds.  Delivery of placenta was spontaneous. Placenta was found to be intact, 3 -vessel cord was noted. The fundus was found to be firm. 2nd degree perineal laceration was repaired in the normal sterile fashion with  vicryl. Estimated blood loss 250cc. Instrument and gauze counts were correct at the end of the procedure.   Placenta status: to L&D for disposal  Anesthesia:  epidural Episiotomy:  none Lacerations:  2nd degree perineal laceration Suture Repair: 2.0 3.0 vicryl Est. Blood Loss (mL):  250  Mom to postpartum.  Baby to Couplet care / Skin to Skin.  [name] 07/19/2021, 6:13 PM

## 2020-12-27 LAB — OB RESULTS CONSOLE HEPATITIS B SURFACE ANTIGEN: Hepatitis B Surface Ag: NEGATIVE

## 2020-12-27 LAB — OB RESULTS CONSOLE RPR: RPR: NONREACTIVE

## 2020-12-27 LAB — OB RESULTS CONSOLE GC/CHLAMYDIA
Chlamydia: NEGATIVE
Gonorrhea: NEGATIVE

## 2020-12-27 LAB — OB RESULTS CONSOLE RUBELLA ANTIBODY, IGM: Rubella: NON-IMMUNE/NOT IMMUNE

## 2020-12-27 LAB — OB RESULTS CONSOLE HIV ANTIBODY (ROUTINE TESTING): HIV: NONREACTIVE

## 2021-01-03 ENCOUNTER — Telehealth: Payer: Self-pay | Admitting: *Deleted

## 2021-01-03 NOTE — Telephone Encounter (Signed)
Per referral 01/02/21 - called patient and gave upcoming appointments

## 2021-01-30 ENCOUNTER — Other Ambulatory Visit: Payer: Self-pay | Admitting: Family

## 2021-01-30 DIAGNOSIS — D689 Coagulation defect, unspecified: Secondary | ICD-10-CM

## 2021-02-01 ENCOUNTER — Encounter: Payer: Self-pay | Admitting: Hematology & Oncology

## 2021-02-01 ENCOUNTER — Inpatient Hospital Stay: Payer: BLUE CROSS/BLUE SHIELD | Admitting: Hematology & Oncology

## 2021-02-01 ENCOUNTER — Other Ambulatory Visit: Payer: Self-pay

## 2021-02-01 ENCOUNTER — Telehealth: Payer: Self-pay | Admitting: *Deleted

## 2021-02-01 ENCOUNTER — Inpatient Hospital Stay: Payer: BLUE CROSS/BLUE SHIELD | Attending: Hematology & Oncology

## 2021-02-01 DIAGNOSIS — Z3A00 Weeks of gestation of pregnancy not specified: Secondary | ICD-10-CM

## 2021-02-01 DIAGNOSIS — D689 Coagulation defect, unspecified: Secondary | ICD-10-CM | POA: Insufficient documentation

## 2021-02-01 DIAGNOSIS — O99119 Other diseases of the blood and blood-forming organs and certain disorders involving the immune mechanism complicating pregnancy, unspecified trimester: Secondary | ICD-10-CM | POA: Insufficient documentation

## 2021-02-01 LAB — CMP (CANCER CENTER ONLY)
ALT: 6 U/L (ref 0–44)
AST: 10 U/L — ABNORMAL LOW (ref 15–41)
Albumin: 3.9 g/dL (ref 3.5–5.0)
Alkaline Phosphatase: 46 U/L (ref 38–126)
Anion gap: 8 (ref 5–15)
BUN: 9 mg/dL (ref 6–20)
CO2: 27 mmol/L (ref 22–32)
Calcium: 9.6 mg/dL (ref 8.9–10.3)
Chloride: 100 mmol/L (ref 98–111)
Creatinine: 0.53 mg/dL (ref 0.44–1.00)
GFR, Estimated: >60 mL/min (ref >60)
Glucose, Bld: 91 mg/dL (ref 70–99)
Potassium: 3.7 mmol/L (ref 3.5–5.1)
Sodium: 135 mmol/L (ref 135–145)
Total Bilirubin: 0.4 mg/dL (ref 0.3–1.2)
Total Protein: 6.2 g/dL — ABNORMAL LOW (ref 6.5–8.1)

## 2021-02-01 LAB — CBC WITH DIFFERENTIAL (CANCER CENTER ONLY)
Abs Immature Granulocytes: 0.06 10*3/uL (ref 0.00–0.07)
Basophils Absolute: 0 10*3/uL (ref 0.0–0.1)
Basophils Relative: 0 %
Eosinophils Absolute: 0.1 10*3/uL (ref 0.0–0.5)
Eosinophils Relative: 1 %
HCT: 38 % (ref 36.0–46.0)
Hemoglobin: 13.2 g/dL (ref 12.0–15.0)
Immature Granulocytes: 1 %
Lymphocytes Relative: 20 %
Lymphs Abs: 2.4 10*3/uL (ref 0.7–4.0)
MCH: 30.7 pg (ref 26.0–34.0)
MCHC: 34.7 g/dL (ref 30.0–36.0)
MCV: 88.4 fL (ref 80.0–100.0)
Monocytes Absolute: 0.6 10*3/uL (ref 0.1–1.0)
Monocytes Relative: 5 %
Neutro Abs: 9 10*3/uL — ABNORMAL HIGH (ref 1.7–7.7)
Neutrophils Relative %: 73 %
Platelet Count: 168 10*3/uL (ref 150–400)
RBC: 4.3 MIL/uL (ref 3.87–5.11)
RDW: 12.5 % (ref 11.5–15.5)
WBC Count: 12.1 10*3/uL — ABNORMAL HIGH (ref 4.0–10.5)
nRBC: 0 % (ref 0.0–0.2)

## 2021-02-01 LAB — PROTIME-INR
INR: 0.9 (ref 0.8–1.2)
Prothrombin Time: 12.3 seconds (ref 11.4–15.2)

## 2021-02-01 LAB — APTT: aPTT: 26 seconds (ref 24–36)

## 2021-02-01 NOTE — Progress Notes (Signed)
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  HPI: Ms. Broecker is a very nice 25 year old white female.  She is originally from [place].  We had a good time talking about [place].  She is pregnant.  This is her first pregnancy.  She actually is 4 months pregnant.  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  She said that whenever she had a cut or a lab work done, that she would bleed.  She says she saw a hematologist.  She only saw the hematologist once.  I am not sure and she is not sure of any studies were done to try to document what the problem is.  She has an IUD in because of heavy monthly cycles.  She has had her wisdom teeth out.  She does not recall any issues with bleeding.  There is no family history of bleeding as far she knows.  She has 2 sisters who are doing okay.  1 sister just had a child without any complications.  She does not smoke.  She does have quite a few tattoos.  She has had that she does have some bleeding when she gets tattoos.  She has had no fever.  She has had no change in bowel or bladder habits.  She has had no bleeding with the bowels or bladder.  There is been no bruising.  I would have to say that her performance status right now is ECOG 0.    Past Medical History:  Diagnosis Date   Allergy    HPV (human papilloma virus) infection    PCOS (polycystic ovarian syndrome)   :   Past Surgical History:  Procedure Laterality Date   CERVICAL BIOPSY    :   Current Outpatient Medications:    Fluvoxamine Maleate 150 MG CP24, Take 1 capsule by mouth at bedtime., Disp: , Rfl:    lamoTRIgine (LAMICTAL) 150 MG tablet, Take 150 mg by mouth 2 (two) times daily., Disp: , Rfl: :  :   Allergies  Allergen Reactions   Other Other (See Comments)    Cats, grass, cherries  CHERRYS     CAT   GRASS Cats, grass, cherries  CHERRYS    CAT   GRASS   :   Family History  Problem Relation Age of Onset   Hyperlipidemia Mother    Hyperlipidemia Father    Hyperlipidemia Sister    Hyperlipidemia Maternal Grandmother    Stroke Maternal Grandmother    Mental illness Maternal Grandmother    Hyperlipidemia Maternal Grandfather   :   Social History   Socioeconomic History   Marital status: Single    Spouse name: Not on file   Number of children: Not on file   Years of education: Not on file   Highest education level: Not on file  Occupational History   Not on file  Tobacco Use   Smoking status: Never   Smokeless tobacco: Never  Substance and Sexual Activity   Alcohol use: No   Drug use: Yes    Types: Marijuana   Sexual activity: Not on file  Other Topics Concern   Not on file  Social History Narrative   Not on file   Social Determinants of Health   Financial Resource Strain: Not on file  Food Insecurity: Not on file  Transportation  Needs: Not on file  Physical Activity: Not on file  Stress: Not on file  Social Connections: Not on file  Intimate Partner Violence: Not on file  :  Review of Systems  Constitutional: Negative.   HENT: Negative.    Eyes: Negative.   Respiratory: Negative.    Cardiovascular: Negative.   Gastrointestinal: Negative.   Genitourinary: Negative.   Musculoskeletal: Negative.   Skin: Negative.   Neurological: Negative.   Endo/Heme/Allergies:  Bruises/bleeds easily.  Psychiatric/Behavioral: Negative.      Exam:  This is a well-developed well-nourished white female in no obvious distress.  Vital signs show temperature of 98.7.  Pulse 98.  Blood pressure 101/60.  Weight is 184 pounds.  Head and neck exam shows no scleral icterus.  There is no oral lesions.  She has no adenopathy in the neck.  Lungs are clear bilaterally.  Cardiac exam regular rate and rhythm with no murmurs, rubs or bruits.  Abdomen is soft.  She has good bowel  sounds.  There is no fluid wave.  There is no palpable liver or spleen tip.  Back exam shows no tenderness over the spine, ribs or hips.  Extremities shows no clubbing, cyanosis or edema.  Skin exam shows multiple tattoos.  I do not see any bruising.  Neurological exam is nonfocal.  @IPVITALS @    Recent Labs    02/01/21 0843  WBC 12.1*  HGB 13.2  HCT 38.0  PLT 168    Recent Labs    02/01/21 0843  NA 135  K 3.7  CL 100  CO2 27  GLUCOSE 91  BUN 9  CREATININE 0.53  CALCIUM 9.6    Blood smear review: Pending  Pathology: None    Assessment and Plan: Ms. Rouser is a very interesting and very nice 25 year old white female.  She is pregnant.  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  Her PTT is normal.  As such, I would not think that von Willebrand's would be a problem.  However, with pregnancy, von Willebrand levels can go up.  There is no family history of bleeding.  As such, I would think a hereditary hemorrhagic type state would be an likely.  We will have to see what her prothrombin time is.  I think the fact that she had her wisdom teeth out and never had problems with bleeding would certainly go against any type of coagulopathy.  I want to make sure that we do a thorough evaluation for her.  I want to make sure that she and her soon-to-be born son are going to be healthy during this pregnancy and delivery.  I will have to see what the blood smear looks like.  Again, we will see what the prothrombin time is.  I would like to believe that she will not have any problems with pregnancy and delivery.  Typically, you will see an increase in clotting factors and von Willebrand factor during pregnancy.  I would like to see her back in 6 weeks just for follow-up.  Of note, her baby boy is due on December 15.

## 2021-02-01 NOTE — Telephone Encounter (Signed)
Per 02/01/21 los - gave patient upcoming appointment - patient confirmed - print calendar

## 2021-02-05 LAB — VON WILLEBRAND PANEL
Coagulation Factor VIII: 111 % (ref 56–140)
Ristocetin Co-factor, Plasma: 112 % (ref 50–200)
Von Willebrand Antigen, Plasma: 136 % (ref 50–200)

## 2021-02-05 LAB — COAG STUDIES INTERP REPORT

## 2021-03-04 ENCOUNTER — Inpatient Hospital Stay: Payer: BLUE CROSS/BLUE SHIELD | Attending: Hematology & Oncology

## 2021-03-04 ENCOUNTER — Encounter: Payer: Self-pay | Admitting: Hematology & Oncology

## 2021-03-04 ENCOUNTER — Other Ambulatory Visit: Payer: Self-pay

## 2021-03-04 ENCOUNTER — Telehealth: Payer: Self-pay

## 2021-03-04 ENCOUNTER — Inpatient Hospital Stay: Payer: BLUE CROSS/BLUE SHIELD | Admitting: Hematology & Oncology

## 2021-03-04 VITALS — BP 108/63 | HR 104 | Temp 98.6°F | Resp 18 | Wt 154.0 lb

## 2021-03-04 DIAGNOSIS — D689 Coagulation defect, unspecified: Secondary | ICD-10-CM | POA: Insufficient documentation

## 2021-03-04 DIAGNOSIS — Z3A00 Weeks of gestation of pregnancy not specified: Secondary | ICD-10-CM | POA: Diagnosis present

## 2021-03-04 DIAGNOSIS — O99112 Other diseases of the blood and blood-forming organs and certain disorders involving the immune mechanism complicating pregnancy, second trimester: Secondary | ICD-10-CM | POA: Diagnosis present

## 2021-03-04 LAB — PROTIME-INR
INR: 0.9 (ref 0.8–1.2)
Prothrombin Time: 12.3 seconds (ref 11.4–15.2)

## 2021-03-04 LAB — CBC WITH DIFFERENTIAL (CANCER CENTER ONLY)
Abs Immature Granulocytes: 0.1 10*3/uL — ABNORMAL HIGH (ref 0.00–0.07)
Basophils Absolute: 0 10*3/uL (ref 0.0–0.1)
Basophils Relative: 0 %
Eosinophils Absolute: 0.1 10*3/uL (ref 0.0–0.5)
Eosinophils Relative: 1 %
HCT: 37 % (ref 36.0–46.0)
Hemoglobin: 12.9 g/dL (ref 12.0–15.0)
Immature Granulocytes: 1 %
Lymphocytes Relative: 18 %
Lymphs Abs: 2.4 10*3/uL (ref 0.7–4.0)
MCH: 31.1 pg (ref 26.0–34.0)
MCHC: 34.9 g/dL (ref 30.0–36.0)
MCV: 89.2 fL (ref 80.0–100.0)
Monocytes Absolute: 0.7 10*3/uL (ref 0.1–1.0)
Monocytes Relative: 5 %
Neutro Abs: 10.3 10*3/uL — ABNORMAL HIGH (ref 1.7–7.7)
Neutrophils Relative %: 75 %
Platelet Count: 174 10*3/uL (ref 150–400)
RBC: 4.15 MIL/uL (ref 3.87–5.11)
RDW: 12.5 % (ref 11.5–15.5)
WBC Count: 13.6 10*3/uL — ABNORMAL HIGH (ref 4.0–10.5)
nRBC: 0 % (ref 0.0–0.2)

## 2021-03-04 LAB — CMP (CANCER CENTER ONLY)
ALT: 6 U/L (ref 0–44)
AST: 9 U/L — ABNORMAL LOW (ref 15–41)
Albumin: 3.5 g/dL (ref 3.5–5.0)
Alkaline Phosphatase: 49 U/L (ref 38–126)
Anion gap: 8 (ref 5–15)
BUN: 9 mg/dL (ref 6–20)
CO2: 27 mmol/L (ref 22–32)
Calcium: 9.4 mg/dL (ref 8.9–10.3)
Chloride: 103 mmol/L (ref 98–111)
Creatinine: 0.53 mg/dL (ref 0.44–1.00)
GFR, Estimated: >60 mL/min (ref >60)
Glucose, Bld: 87 mg/dL (ref 70–99)
Potassium: 4 mmol/L (ref 3.5–5.1)
Sodium: 138 mmol/L (ref 135–145)
Total Bilirubin: 0.3 mg/dL (ref 0.3–1.2)
Total Protein: 6.1 g/dL — ABNORMAL LOW (ref 6.5–8.1)

## 2021-03-04 LAB — APTT: aPTT: 25 seconds (ref 24–36)

## 2021-03-04 LAB — LACTATE DEHYDROGENASE: LDH: 102 U/L (ref 98–192)

## 2021-03-04 LAB — SAVE SMEAR(SSMR), FOR PROVIDER SLIDE REVIEW

## 2021-03-04 NOTE — Telephone Encounter (Signed)
Appts made and printed for pt per 03/04/21 los  Kayla Palmer 

## 2021-03-04 NOTE — Progress Notes (Signed)
Hematology and Oncology Follow Up Visit  Kayla Palmer [id number] 1995-10-14 24 y.o. 03/04/2021   Principle Diagnosis:  Second trimester pregnancy-possible clotting disorder  Current Therapy:   Observation     Interim History:  Kayla Palmer is back for second office visit.  We first saw her back in early July.  At that time, she was told that she had hemophilia.  We had seen her because she is pregnant.  However, we cannot find any evidence of hemophilia.  She has a normal PT and PTT.  We did von Willebrand studies.  These were all normal.  She has not had a bleeding problem.  She is 5 months pregnant now.  She has had no issues with respect to her pregnancy.  She is due on July 18, 2021.  She will have a son.  She has had no cough or shortness of breath.  There is been no leg swelling.  She has had no bruising.  She has had no change in bowel or bladder habits.  There has been no headache.  Overall, I would have to say that her performance status is ECOG 0.  Medications:  Current Outpatient Medications:    Fluvoxamine Maleate 150 MG CP24, Take 1 capsule by mouth at bedtime., Disp: , Rfl:    lamoTRIgine (LAMICTAL) 150 MG tablet, Take 150 mg by mouth 2 (two) times daily., Disp: , Rfl:   Allergies:  Allergies  Allergen Reactions   Other Other (See Comments)    Cats, grass, cherries  CHERRYS    CAT   GRASS Cats, grass, cherries  CHERRYS    CAT   GRASS     Past Medical History, Surgical history, Social history, and Family History were reviewed and updated.  Review of Systems: Review of Systems  Constitutional: Negative.   HENT:  Negative.    Eyes: Negative.   Respiratory: Negative.    Cardiovascular: Negative.   Gastrointestinal: Negative.   Endocrine: Negative.   Genitourinary: Negative.    Skin: Negative.   Neurological: Negative.   Hematological: Negative.   Psychiatric/Behavioral: Negative.     Physical Exam:  weight is 154 lb (69.9 kg). Her oral temperature is  98.6 F (37 C). Her blood pressure is 108/63 and her pulse is 104 (abnormal). Her respiration is 18 and oxygen saturation is 98%.   Wt Readings from Last 3 Encounters:  03/04/21 154 lb (69.9 kg)  06/23/17 133 lb (60.3 kg)  04/18/15 123 lb (55.8 kg) (48 %, Z= -0.06)*   * Growth percentiles are based on CDC (Girls, 2-20 Years) data.    Physical Exam Vitals reviewed.  HENT:     Head: Normocephalic and atraumatic.  Eyes:     Pupils: Pupils are equal, round, and reactive to light.  Cardiovascular:     Rate and Rhythm: Normal rate and regular rhythm.     Heart sounds: Normal heart sounds.  Pulmonary:     Effort: Pulmonary effort is normal.     Breath sounds: Normal breath sounds.  Abdominal:     General: Bowel sounds are normal.     Palpations: Abdomen is soft.     Comments: Abdominal exam shows a pregnant abdomen.  Her bowel sounds are present.  There is no guarding or rebound tenderness.  There is no palpable liver or spleen tip.  Musculoskeletal:        General: No tenderness or deformity. Normal range of motion.     Cervical back: Normal range of motion.  Lymphadenopathy:  Cervical: No cervical adenopathy.  Skin:    General: Skin is warm and dry.     Findings: No erythema or rash.     Comments: Skin exam shows no ecchymoses or petechia.  She has no rashes.  Neurological:     Mental Status: She is alert and oriented to person, place, and time.  Psychiatric:        Behavior: Behavior normal.        Thought Content: Thought content normal.        Judgment: Judgment normal.     Lab Results  Component Value Date   WBC 13.6 (H) 03/04/2021   HGB 12.9 03/04/2021   HCT 37.0 03/04/2021   MCV 89.2 03/04/2021   PLT 174 03/04/2021     Chemistry      Component Value Date/Time   NA 138 03/04/2021 0908   K 4.0 03/04/2021 0908   CL 103 03/04/2021 0908   CO2 27 03/04/2021 0908   BUN 9 03/04/2021 0908   CREATININE 0.53 03/04/2021 0908      Component Value Date/Time    CALCIUM 9.4 03/04/2021 0908   ALKPHOS 49 03/04/2021 0908   AST 9 (L) 03/04/2021 0908   ALT 6 03/04/2021 0908   BILITOT 0.3 03/04/2021 0908     Impression and Plan: Kayla Palmer is a very charming 25 year old white female.  Tom[birth year] is her birthday.  I am sure she will have a wonderful birthday.  I gave her a recommendation for an [place].  She says that she has a hard time finding a good a [place] down in [place].  Again I just do not think that she has a bleeding disorder.  I cannot find anything with her coagulation studies.  Being a woman, I would not think that she would have any Hemophilia.  If she did, I would expect that her pro time or PTT would be elevated.  If she had von Willebrand's, this could be a possibility but levels always go up during pregnancy.  I would like to get her back in November.  She is due on December 15.  I would like to see her back 1 more time before she delivers.   Josph Macho, MD 8/1/202210:05 AM

## 2021-04-16 LAB — OB RESULTS CONSOLE RPR: RPR: NONREACTIVE

## 2021-05-01 ENCOUNTER — Emergency Department (HOSPITAL_COMMUNITY): Payer: BLUE CROSS/BLUE SHIELD

## 2021-05-01 ENCOUNTER — Emergency Department (HOSPITAL_COMMUNITY)
Admission: EM | Admit: 2021-05-01 | Discharge: 2021-05-01 | Disposition: A | Discharge status: Home / Self Care | Payer: BLUE CROSS/BLUE SHIELD | Attending: Emergency Medicine | Admitting: Emergency Medicine

## 2021-05-01 ENCOUNTER — Encounter (HOSPITAL_COMMUNITY): Payer: Self-pay | Admitting: *Deleted

## 2021-05-01 ENCOUNTER — Other Ambulatory Visit: Payer: Self-pay

## 2021-05-01 DIAGNOSIS — O26893 Other specified pregnancy related conditions, third trimester: Secondary | ICD-10-CM | POA: Diagnosis present

## 2021-05-01 DIAGNOSIS — R1031 Right lower quadrant pain: Secondary | ICD-10-CM | POA: Diagnosis not present

## 2021-05-01 DIAGNOSIS — Z3A29 29 weeks gestation of pregnancy: Secondary | ICD-10-CM

## 2021-05-01 DIAGNOSIS — Y92410 Unspecified street and highway as the place of occurrence of the external cause: Secondary | ICD-10-CM | POA: Insufficient documentation

## 2021-05-01 DIAGNOSIS — R1032 Left lower quadrant pain: Secondary | ICD-10-CM

## 2021-05-01 LAB — COMPREHENSIVE METABOLIC PANEL
ALT: 8 U/L (ref 0–44)
AST: 14 U/L — ABNORMAL LOW (ref 15–41)
Albumin: 2.6 g/dL — ABNORMAL LOW (ref 3.5–5.0)
Alkaline Phosphatase: 64 U/L (ref 38–126)
Anion gap: 6 (ref 5–15)
BUN: 7 mg/dL (ref 6–20)
CO2: 22 mmol/L (ref 22–32)
Calcium: 9 mg/dL (ref 8.9–10.3)
Chloride: 106 mmol/L (ref 98–111)
Creatinine, Ser: 0.48 mg/dL (ref 0.44–1.00)
GFR, Estimated: >60 mL/min (ref >60)
Glucose, Bld: 93 mg/dL (ref 70–99)
Potassium: 3.9 mmol/L (ref 3.5–5.1)
Sodium: 134 mmol/L — ABNORMAL LOW (ref 135–145)
Total Bilirubin: 0.4 mg/dL (ref 0.3–1.2)
Total Protein: 5.8 g/dL — ABNORMAL LOW (ref 6.5–8.1)

## 2021-05-01 LAB — CBC WITH DIFFERENTIAL/PLATELET
Abs Immature Granulocytes: 0.13 10*3/uL — ABNORMAL HIGH (ref 0.00–0.07)
Basophils Absolute: 0 10*3/uL (ref 0.0–0.1)
Basophils Relative: 0 %
Eosinophils Absolute: 0.1 10*3/uL (ref 0.0–0.5)
Eosinophils Relative: 0 %
HCT: 36.9 % (ref 36.0–46.0)
Hemoglobin: 12.7 g/dL (ref 12.0–15.0)
Immature Granulocytes: 1 %
Lymphocytes Relative: 12 %
Lymphs Abs: 1.5 10*3/uL (ref 0.7–4.0)
MCH: 30.1 pg (ref 26.0–34.0)
MCHC: 34.4 g/dL (ref 30.0–36.0)
MCV: 87.4 fL (ref 80.0–100.0)
Monocytes Absolute: 0.7 10*3/uL (ref 0.1–1.0)
Monocytes Relative: 6 %
Neutro Abs: 9.6 10*3/uL — ABNORMAL HIGH (ref 1.7–7.7)
Neutrophils Relative %: 81 %
Platelets: 160 10*3/uL (ref 150–400)
RBC: 4.22 MIL/uL (ref 3.87–5.11)
RDW: 12.6 % (ref 11.5–15.5)
WBC: 12 10*3/uL — ABNORMAL HIGH (ref 4.0–10.5)
nRBC: 0 % (ref 0.0–0.2)

## 2021-05-01 LAB — TYPE AND SCREEN
ABO/RH(D): O POS
Antibody Screen: NEGATIVE

## 2021-05-01 MED ORDER — IOHEXOL 350 MG/ML SOLN
70.0000 mL | Freq: Once | INTRAVENOUS | Status: AC | PRN
  Administered 2021-05-01: 70 mL via INTRAVENOUS

## 2021-05-01 NOTE — ED Notes (Signed)
[facility] reports they are in a delivery & will come as soon as they are able.

## 2021-05-01 NOTE — ED Notes (Signed)
[facility] at bedside 
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
## 2021-05-01 NOTE — Progress Notes (Signed)
Patient to CT.

## 2021-05-01 NOTE — ED Notes (Signed)
Patient transported to CT 
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
## 2021-05-01 NOTE — Progress Notes (Signed)
Patient back from CT.
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
## 2021-05-01 NOTE — Progress Notes (Signed)
FHM reactive and reassuring for 28 6/[redacted] weeks gestation.  Cleared by OB Service.  Call office to make an appt and call office with any questions or concerns.

## 2021-05-01 NOTE — Discharge Instructions (Signed)
Your scans today and fetal monitoring's are reassuring.  You can take Tylenol as needed for pain.  Stay hydrated.  See your [facility] doctor for follow-up.  Return to ER or MAU if you have severe pain or if you feel the baby is not moving, vomiting, fever
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
## 2021-06-12 LAB — OB RESULTS CONSOLE GC/CHLAMYDIA
Chlamydia: NEGATIVE
Gonorrhea: NEGATIVE

## 2021-06-20 ENCOUNTER — Inpatient Hospital Stay: Payer: BLUE CROSS/BLUE SHIELD | Admitting: Hematology & Oncology

## 2021-06-20 ENCOUNTER — Inpatient Hospital Stay: Payer: BLUE CROSS/BLUE SHIELD

## 2021-06-26 LAB — OB RESULTS CONSOLE GBS: GBS: NEGATIVE

## 2021-07-18 ENCOUNTER — Inpatient Hospital Stay (HOSPITAL_COMMUNITY)
Admission: AD | Admit: 2021-07-18 | Discharge: 2021-07-21 | DRG: 807 | Disposition: A | Discharge status: Home / Self Care | Payer: BLUE CROSS/BLUE SHIELD | Attending: Obstetrics and Gynecology | Admitting: Obstetrics and Gynecology

## 2021-07-18 ENCOUNTER — Encounter (HOSPITAL_COMMUNITY): Payer: Self-pay | Admitting: Obstetrics

## 2021-07-18 ENCOUNTER — Inpatient Hospital Stay (HOSPITAL_COMMUNITY): Payer: BLUE CROSS/BLUE SHIELD

## 2021-07-18 DIAGNOSIS — Z3A40 40 weeks gestation of pregnancy: Secondary | ICD-10-CM

## 2021-07-18 DIAGNOSIS — Z23 Encounter for immunization: Secondary | ICD-10-CM | POA: Diagnosis not present

## 2021-07-18 DIAGNOSIS — Z20822 Contact with and (suspected) exposure to covid-19: Secondary | ICD-10-CM | POA: Diagnosis present

## 2021-07-18 DIAGNOSIS — O480 Post-term pregnancy: Secondary | ICD-10-CM | POA: Diagnosis present

## 2021-07-18 HISTORY — DX: Other specified health status: Z78.9

## 2021-07-18 LAB — CBC
HCT: 32.3 % — ABNORMAL LOW (ref 36.0–46.0)
Hemoglobin: 10.8 g/dL — ABNORMAL LOW (ref 12.0–15.0)
MCH: 28 pg (ref 26.0–34.0)
MCHC: 33.4 g/dL (ref 30.0–36.0)
MCV: 83.7 fL (ref 80.0–100.0)
Platelets: 157 10*3/uL (ref 150–400)
RBC: 3.86 MIL/uL — ABNORMAL LOW (ref 3.87–5.11)
RDW: 12.3 % (ref 11.5–15.5)
WBC: 10.1 10*3/uL (ref 4.0–10.5)
nRBC: 0 % (ref 0.0–0.2)

## 2021-07-18 LAB — TYPE AND SCREEN
ABO/RH(D): O POS
Antibody Screen: NEGATIVE

## 2021-07-18 LAB — RESP PANEL BY RT-PCR (FLU A&B, COVID) ARPGX2
Influenza A by PCR: NEGATIVE
Influenza B by PCR: NEGATIVE
SARS Coronavirus 2 by RT PCR: NEGATIVE

## 2021-07-18 MED ORDER — FENTANYL CITRATE (PF) 100 MCG/2ML IJ SOLN
50.0000 ug | INTRAMUSCULAR | Status: DC | PRN
  Administered 2021-07-19 (×3): 100 ug via INTRAVENOUS
  Filled 2021-07-18 (×3): qty 2

## 2021-07-18 MED ORDER — LACTATED RINGERS IV SOLN: INTRAVENOUS | Status: DC

## 2021-07-18 MED ORDER — LIDOCAINE HCL (PF) 1 % IJ SOLN
30.0000 mL | INTRAMUSCULAR | Status: AC | PRN
  Administered 2021-07-19: 30 mL via SUBCUTANEOUS
  Filled 2021-07-18: qty 30

## 2021-07-18 MED ORDER — OXYTOCIN-SODIUM CHLORIDE 30-0.9 UT/500ML-% IV SOLN: 2.5000 [IU]/h | INTRAVENOUS | Status: DC

## 2021-07-18 MED ORDER — ACETAMINOPHEN 325 MG PO TABS: 650.0000 mg | ORAL_TABLET | ORAL | Status: DC | PRN

## 2021-07-18 MED ORDER — OXYTOCIN BOLUS FROM INFUSION
333.0000 mL | Freq: Once | INTRAVENOUS | Status: AC
  Administered 2021-07-19: 333 mL via INTRAVENOUS

## 2021-07-18 MED ORDER — OXYCODONE-ACETAMINOPHEN 5-325 MG PO TABS: 1.0000 | ORAL_TABLET | ORAL | Status: DC | PRN

## 2021-07-18 MED ORDER — TERBUTALINE SULFATE 1 MG/ML IJ SOLN
0.2500 mg | Freq: Once | INTRAMUSCULAR | Status: DC | PRN
  Filled 2021-07-18: qty 1

## 2021-07-18 MED ORDER — ONDANSETRON HCL 4 MG/2ML IJ SOLN
4.0000 mg | Freq: Four times a day (QID) | INTRAMUSCULAR | Status: DC | PRN
  Administered 2021-07-19 (×2): 4 mg via INTRAVENOUS
  Filled 2021-07-18 (×2): qty 2

## 2021-07-18 MED ORDER — OXYCODONE-ACETAMINOPHEN 5-325 MG PO TABS: 2.0000 | ORAL_TABLET | ORAL | Status: DC | PRN

## 2021-07-18 MED ORDER — SOD CITRATE-CITRIC ACID 500-334 MG/5ML PO SOLN: 30.0000 mL | ORAL | Status: DC | PRN

## 2021-07-18 MED ORDER — LACTATED RINGERS IV SOLN: 500.0000 mL | INTRAVENOUS | Status: DC | PRN

## 2021-07-18 MED ORDER — MISOPROSTOL 25 MCG QUARTER TABLET
25.0000 ug | ORAL_TABLET | ORAL | Status: DC | PRN
  Administered 2021-07-18 (×2): 25 ug via VAGINAL
  Filled 2021-07-18 (×2): qty 1

## 2021-07-18 NOTE — H&P (Signed)
25 y.o. G1P0 @ [redacted]w[redacted]d presents for  induction of labor for post term pregnancy.  Otherwise has good fetal movement and no bleeding.     Pregnancy complicated by: Bipolar disorder: Was on lamotrigine and fluvoxamine.  Stopped about 1 month ago.  Sees psychiatry.   2.  Bleeding disorder work up during pregnancy.  Patient saw hematology (Dr. 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  She reports one visit to hematology as a child with minimal work up.   He did blood work early in pregnancy--normal PT and PTT.  No history of bleeding with dental procedures or family history of bleeding.  He does not feel that she has any bleeding disorder.  3.  Posterior placenta with anterior accessory lobe  Past Medical History:  Diagnosis Date   Allergy    HPV (human papilloma virus) infection    Medical history non-contributory    PCOS (polycystic ovarian syndrome)     Past Surgical History:  Procedure Laterality Date   CERVICAL BIOPSY     NO PAST SURGERIES      OB History  Gravida Para Term Preterm AB Living  1            SAB IAB Ectopic Multiple Live Births               # Outcome Date GA Lbr Len/2nd Weight Sex Delivery Anes PTL Lv  1 Current             Social History   Socioeconomic History   Marital status: Significant Other    Spouse name: Not on file   Number of children: Not on file   Years of education: Not on file   Highest education level: Not on file  Occupational History   Not on file  Tobacco Use   Smoking status: Never   Smokeless tobacco: Never  Vaping Use   Vaping Use: Never used  Substance and Sexual Activity   Alcohol use: No   Drug use: Not Currently    Types: Marijuana   Sexual activity: Not Currently    Birth control/protection: Pill, None  Other Topics Concern   Not on file  Social History Narrative   Not on file   Social Determinants of Health   Financial Resource Strain: Not on file  Food  Insecurity: Not on file  Transportation Needs: Not on file  Physical Activity: Not on file  Stress: Not on file  Social Connections: Not on file  Intimate Partner Violence: Not on file   Other, Cat hair extract, Cherry, and Grass pollen([id number] vern)    Prenatal Transfer Tool  Maternal Diabetes: No Genetic Screening: Normal Maternal Ultrasounds/Referrals: Normal Fetal Ultrasounds or other Referrals:  None Maternal Substance Abuse:  No Significant Maternal Medications:  Meds include: Other:  lamotrigine and fluvoxamine Significant Maternal Lab Results: Group B Strep negative  ABO, Rh: --/--/O POS (12/15 1708) Antibody: NEG (12/15 1708) Rubella: Nonimmune (05/26 0000) RPR: Nonreactive (09/13 0000)  HBsAg: Negative (05/26 0000)  HIV: Non-reactive (05/26 0000)  GBS: Negative/-- (11/23 0000)     Vitals:   07/18/21 2053 07/18/21 2100  BP: 135/83 136/75  Pulse: 76 64  Resp:    Temp: 98.1 F (36.7 C)      General:  NAD Abdomen:  soft, gravid, EFW 7.5# SVE:  1/50/-3/posterior/soft FHTs:  130s, moderate variability, category 1 Toco:  quiet   A/P   25 y.o. G1P0 [redacted]w[redacted]d  presents for  induction of labor for post term pregnancy IOL: Cervix unfavorable.  Has received 1 dose of cytotec.  Discussed foley balloon placement.  She desires additional dose of cytotec now and will consider at next check Bipolar disorder: Sees psych but stop medication on her own in the last month.  She reports that she is doing well.  She wants to consider restarting medication after delivery Succenturiate lobe of placenta: will examine carefully after delivery   FSR/ vtx/ GBS negative  Natallia Stellmach GEFFEL Favian Kittleson

## 2021-07-19 ENCOUNTER — Inpatient Hospital Stay (HOSPITAL_COMMUNITY): Payer: BLUE CROSS/BLUE SHIELD | Admitting: Anesthesiology

## 2021-07-19 ENCOUNTER — Encounter (HOSPITAL_COMMUNITY): Payer: Self-pay | Admitting: Obstetrics

## 2021-07-19 LAB — RPR: RPR Ser Ql: NONREACTIVE

## 2021-07-19 MED ORDER — BISACODYL 10 MG RE SUPP: 10.0000 mg | Freq: Every day | RECTAL | Status: DC | PRN

## 2021-07-19 MED ORDER — LACTATED RINGERS IV SOLN
500.0000 mL | Freq: Once | INTRAVENOUS | Status: AC
  Administered 2021-07-19: 500 mL via INTRAVENOUS

## 2021-07-19 MED ORDER — PHENYLEPHRINE 40 MCG/ML (10ML) SYRINGE FOR IV PUSH (FOR BLOOD PRESSURE SUPPORT): 80.0000 ug | PREFILLED_SYRINGE | INTRAVENOUS | Status: DC | PRN

## 2021-07-19 MED ORDER — TETANUS-DIPHTH-ACELL PERTUSSIS 5-2.5-18.5 LF-MCG/0.5 IM SUSY: 0.5000 mL | PREFILLED_SYRINGE | Freq: Once | INTRAMUSCULAR | Status: DC

## 2021-07-19 MED ORDER — SIMETHICONE 80 MG PO CHEW: 80.0000 mg | CHEWABLE_TABLET | ORAL | Status: DC | PRN

## 2021-07-19 MED ORDER — FLEET ENEMA 7-19 GM/118ML RE ENEM: 1.0000 | ENEMA | Freq: Every day | RECTAL | Status: DC | PRN

## 2021-07-19 MED ORDER — DOCUSATE SODIUM 100 MG PO CAPS
100.0000 mg | ORAL_CAPSULE | Freq: Two times a day (BID) | ORAL | Status: DC
  Administered 2021-07-20 – 2021-07-21 (×3): 100 mg via ORAL
  Filled 2021-07-19 (×3): qty 1

## 2021-07-19 MED ORDER — DIBUCAINE (PERIANAL) 1 % EX OINT: 1.0000 "application " | TOPICAL_OINTMENT | CUTANEOUS | Status: DC | PRN

## 2021-07-19 MED ORDER — COCONUT OIL OIL: 1.0000 "application " | TOPICAL_OIL | Status: DC | PRN

## 2021-07-19 MED ORDER — EPHEDRINE 5 MG/ML INJ: 10.0000 mg | INTRAVENOUS | Status: DC | PRN

## 2021-07-19 MED ORDER — OXYTOCIN-SODIUM CHLORIDE 30-0.9 UT/500ML-% IV SOLN
1.0000 m[IU]/min | INTRAVENOUS | Status: DC
  Administered 2021-07-19: 1 m[IU]/min via INTRAVENOUS
  Filled 2021-07-19: qty 500

## 2021-07-19 MED ORDER — ACETAMINOPHEN 325 MG PO TABS
650.0000 mg | ORAL_TABLET | ORAL | Status: DC | PRN
  Administered 2021-07-19 – 2021-07-20 (×2): 650 mg via ORAL
  Filled 2021-07-19 (×3): qty 2

## 2021-07-19 MED ORDER — ONDANSETRON HCL 4 MG/2ML IJ SOLN: 4.0000 mg | INTRAMUSCULAR | Status: DC | PRN

## 2021-07-19 MED ORDER — ONDANSETRON HCL 4 MG PO TABS: 4.0000 mg | ORAL_TABLET | ORAL | Status: DC | PRN

## 2021-07-19 MED ORDER — DIPHENHYDRAMINE HCL 50 MG/ML IJ SOLN: 12.5000 mg | INTRAMUSCULAR | Status: DC | PRN

## 2021-07-19 MED ORDER — OXYTOCIN-SODIUM CHLORIDE 30-0.9 UT/500ML-% IV SOLN
1.0000 m[IU]/min | INTRAVENOUS | Status: DC
  Administered 2021-07-19: 4 m[IU]/min via INTRAVENOUS
  Administered 2021-07-19: 2 m[IU]/min via INTRAVENOUS

## 2021-07-19 MED ORDER — BENZOCAINE-MENTHOL 20-0.5 % EX AERO
1.0000 "application " | INHALATION_SPRAY | CUTANEOUS | Status: DC | PRN
  Administered 2021-07-19 – 2021-07-20 (×2): 1 via TOPICAL
  Filled 2021-07-19 (×2): qty 56

## 2021-07-19 MED ORDER — FENTANYL-BUPIVACAINE-NACL 0.5-0.125-0.9 MG/250ML-% EP SOLN
EPIDURAL | Status: DC | PRN
  Administered 2021-07-19: 12 mL/h via EPIDURAL

## 2021-07-19 MED ORDER — PRENATAL MULTIVITAMIN CH
1.0000 | ORAL_TABLET | Freq: Every day | ORAL | Status: DC
  Administered 2021-07-20 – 2021-07-21 (×2): 1 via ORAL
  Filled 2021-07-19 (×2): qty 1

## 2021-07-19 MED ORDER — FENTANYL-BUPIVACAINE-NACL 0.5-0.125-0.9 MG/250ML-% EP SOLN
12.0000 mL/h | EPIDURAL | Status: DC | PRN
  Filled 2021-07-19: qty 250

## 2021-07-19 MED ORDER — LIDOCAINE HCL (PF) 1 % IJ SOLN
INTRAMUSCULAR | Status: DC | PRN
  Administered 2021-07-19: 2 mL via EPIDURAL
  Administered 2021-07-19: 10 mL via EPIDURAL

## 2021-07-19 MED ORDER — OXYCODONE HCL 5 MG PO TABS: 10.0000 mg | ORAL_TABLET | ORAL | Status: DC | PRN

## 2021-07-19 MED ORDER — IBUPROFEN 600 MG PO TABS
600.0000 mg | ORAL_TABLET | Freq: Four times a day (QID) | ORAL | Status: DC
  Administered 2021-07-19 – 2021-07-21 (×8): 600 mg via ORAL
  Filled 2021-07-19 (×8): qty 1

## 2021-07-19 MED ORDER — WITCH HAZEL-GLYCERIN EX PADS: 1.0000 "application " | MEDICATED_PAD | CUTANEOUS | Status: DC | PRN

## 2021-07-19 MED ORDER — DIPHENHYDRAMINE HCL 25 MG PO CAPS: 25.0000 mg | ORAL_CAPSULE | Freq: Four times a day (QID) | ORAL | Status: DC | PRN

## 2021-07-19 MED ORDER — OXYCODONE HCL 5 MG PO TABS: 5.0000 mg | ORAL_TABLET | ORAL | Status: DC | PRN

## 2021-07-19 MED ORDER — TERBUTALINE SULFATE 1 MG/ML IJ SOLN
0.2500 mg | Freq: Once | INTRAMUSCULAR | Status: AC | PRN
  Administered 2021-07-19: 0.25 mg via SUBCUTANEOUS

## 2021-07-19 NOTE — Progress Notes (Signed)
Increased cramping with contractions  BP 139/85    Pulse 87    Temp 98.5 F (36.9 C) (Oral)    Resp 18    Ht 5\' 4"  (1.626 m)    Wt 89.8 kg    BMI 33.99 kg/m   Toco: q2-3 minutes EFM: 130s moderate variability, + accelerations category 1 SVE: 1.5/60/-3, Foley bulb placed and filled with 33mL fluid  G1@ [redacted]w[redacted]d with IOL for term Will start low dose pitocin while foley bulb in place GBS negative Anticipated SVD

## 2021-07-19 NOTE — Lactation Note (Signed)
This note was copied from a baby's chart. Lactation Consultation Note  Patient Name: Kayla Palmer ZPHXT'A Date: 07/19/2021 Reason for consult: Initial assessment;1st time breastfeeding;Term Age:24 hours Per mom, her feeding plan is " pumping only and formula". Dad gave infant 16 mls of Similac total comfort 360, LC discussed pace feeding infant and burping.  LC assisted mom with how to use DEBP,  mom was fitted with 27 mm breast flange, mom was expressing colostrum while pumping mom had 7 mls in breast flange and was still pumping when LC left the room. Mom plans to offer her EBM first before offering infant formula at the next feeding. Mom knows to pump every 3 hours for 15 minutes on initial setting.  Mom shown how to use DEBP & how to disassemble, clean, & reassemble parts.  Mom understands breast milk is good at room temperature for 4 hours. Mom knows to feeding infant by cues, 8 to 12+ or more times within 24 hours. Mom knows to call RN/LC if she has any questions or concerns. Mom made aware of O/P services, breastfeeding support groups, community resources, and our phone # for post-discharge questions.   Maternal Data Has patient been taught Hand Expression?: No Does the patient have breastfeeding experience prior to this delivery?: No  Feeding Mother's Current Feeding Choice: Breast Milk and Formula Nipple Type: Slow - flow  LATCH Score                    Lactation Tools Discussed/Used Tools: Pump Breast pump type: Double-Electric Breast Pump Pump Education: Setup, frequency, and cleaning;Milk Storage Reason for Pumping: Mom choice is to pump only and formula feed. Pumping frequency: Mom will pump every 3 hours for 15 minutes. Pumped volume: 7 mL (Mom was still pumping when LC left the room.)  Interventions    Discharge Pump: DEBP;Personal  Consult Status Consult Status: Follow-up Date: 07/20/21 Follow-up type: In-patient    Danelle Earthly 07/19/2021, 9:47 PM

## 2021-07-19 NOTE — Lactation Note (Signed)
This note was copied from a baby's chart. Lactation Consultation Note  Patient Name: Boy Mehgan Santmyer JJHER'D Date: 07/19/2021   Age:24 hours LC talked with RN Morrie Sheldon) on Vocera mom declined [facility] services in L&D.  Maternal Data    Feeding    LATCH Score                    Lactation Tools Discussed/Used    Interventions    Discharge    Consult Status      Danelle Earthly 07/19/2021, 6:05 PM

## 2021-07-19 NOTE — Anesthesia Procedure Notes (Signed)
Epidural Patient location during procedure: OB Start time: 07/19/2021 5:21 AM End time: 07/19/2021 5:30 AM  Staffing Anesthesiologist: Lannie Fields, DO Performed: anesthesiologist   Preanesthetic Checklist Completed: patient identified, IV checked, risks and benefits discussed, monitors and equipment checked, pre-op evaluation and timeout performed  Epidural Patient position: sitting Prep: DuraPrep and site prepped and draped Patient monitoring: continuous pulse ox, blood pressure, heart rate and cardiac monitor Approach: midline Location: L3-L4 Injection technique: LOR air  Needle:  Needle type: Tuohy  Needle gauge: 17 G Needle length: 9 cm Needle insertion depth: 6 cm Catheter type: closed end flexible Catheter size: 19 Gauge Catheter at skin depth: 11 cm Test dose: negative  Assessment Sensory level: T8 Events: blood not aspirated, injection not painful, no injection resistance, no paresthesia and negative IV test  Additional Notes Patient identified. Risks/Benefits/Options discussed with patient including but not limited to bleeding, infection, nerve damage, paralysis, failed block, incomplete pain control, headache, blood pressure changes, nausea, vomiting, reactions to medication both or allergic, itching and postpartum back pain. Confirmed with bedside nurse the patient's most recent platelet count. Confirmed with patient that they are not currently taking any anticoagulation, have any bleeding history or any family history of bleeding disorders. Patient expressed understanding and wished to proceed. All questions were answered. Sterile technique was used throughout the entire procedure. Please see nursing notes for vital signs. Test dose was given through epidural catheter and negative prior to continuing to dose epidural or start infusion. Warning signs of high block given to the patient including shortness of breath, tingling/numbness in hands, complete motor  block, or any concerning symptoms with instructions to call for help. Patient was given instructions on fall risk and not to get out of bed. All questions and concerns addressed with instructions to call with any issues or inadequate analgesia.  Reason for block:procedure for pain

## 2021-07-19 NOTE — Progress Notes (Signed)
OB Progress Note  S: Patient comfortable with epidural. Did not notice when she ruptured membranes   O: Today's Vitals   07/19/21 0600 07/19/21 0605 07/19/21 0630 07/19/21 0652  BP: 121/82 122/73 116/77   Pulse: 84 79 71   Resp:      Temp:      TempSrc:      SpO2:      Weight:      Height:      PainSc:    0-No pain   Body mass index is 33.99 kg/m.  SVE 6/70/-1, no membranes palpated, IUPC placed  FHR: 120bpm, moderate variability, + accels, appears to be early decels Toco: appears contractions are q2 mins   A/P: 25Y G1P0 @ [redacted]w[redacted]d, IOL post dates Fetal wellbeing: appears to be cat I tracing with early decelerations, now that IUPC is in place will be able to better assess IOL: s/p SROM, titrate pitocin with IUPC in place Pain control: epidural  M. Timothy Lasso, MD 07/19/21 07/19/21

## 2021-07-19 NOTE — Anesthesia Preprocedure Evaluation (Addendum)
Anesthesia Evaluation  Patient identified by MRN, date of birth, ID band Patient awake    Reviewed: Allergy & Precautions, Patient's Chart, lab work & pertinent test results  Airway Mallampati: II  TM Distance: >3 FB Neck ROM: Full    Dental no notable dental hx.    Pulmonary neg pulmonary ROS,    Pulmonary exam normal breath sounds clear to auscultation       Cardiovascular negative cardio ROS Normal cardiovascular exam Rhythm:Regular Rate:Normal     Neuro/Psych negative neurological ROS  negative psych ROS   GI/Hepatic negative GI ROS, Neg liver ROS,   Endo/Other  BMI 34  Renal/GU negative Renal ROS  Female GU complaint     Musculoskeletal negative musculoskeletal ROS (+)   Abdominal   Peds negative pediatric ROS (+)  Hematology negative hematology ROS (+) hct 32.3, plt 157   Anesthesia Other Findings   Reproductive/Obstetrics (+) Pregnancy                            Anesthesia Physical Anesthesia Plan  ASA: 2  Anesthesia Plan: Epidural   Post-op Pain Management:    Induction:   PONV Risk Score and Plan:   Airway Management Planned: Natural Airway  Additional Equipment:   Intra-op Plan:   Post-operative Plan:   Informed Consent: I have reviewed the patients History and Physical, chart, labs and discussed the procedure including the risks, benefits and alternatives for the proposed anesthesia with the patient or authorized representative who has indicated his/her understanding and acceptance.       Plan Discussed with:   Anesthesia Plan Comments:         Anesthesia Quick Evaluation

## 2021-07-19 NOTE — Progress Notes (Signed)
Late Entry OB Progress Note  Into room at 1210 for repetitive late decelerations that did not respond to position change. Pitocin turned off and terbutaline given, with good response. SVE by RN 7.5/80/-1.    Fetal heart rate now category I for the past 1.5 hours.   Contracting q 2-4 mins but inadequate MVUs.  Will restart pitocin at 70mU/min  M. Timothy Lasso, MD 07/19/21 1:46 PM

## 2021-07-19 NOTE — Plan of Care (Signed)
Amore Grater, RN 

## 2021-07-20 LAB — CBC
HCT: 28.3 % — ABNORMAL LOW (ref 36.0–46.0)
Hemoglobin: 9.3 g/dL — ABNORMAL LOW (ref 12.0–15.0)
MCH: 27.6 pg (ref 26.0–34.0)
MCHC: 32.9 g/dL (ref 30.0–36.0)
MCV: 84 fL (ref 80.0–100.0)
Platelets: 142 10*3/uL — ABNORMAL LOW (ref 150–400)
RBC: 3.37 MIL/uL — ABNORMAL LOW (ref 3.87–5.11)
RDW: 12.6 % (ref 11.5–15.5)
WBC: 17.4 10*3/uL — ABNORMAL HIGH (ref 4.0–10.5)
nRBC: 0 % (ref 0.0–0.2)

## 2021-07-20 NOTE — Anesthesia Postprocedure Evaluation (Signed)
Anesthesia Post Note  Patient: Kayla Palmer  Procedure(s) Performed: AN AD HOC LABOR EPIDURAL     Patient location during evaluation: Mother Baby Anesthesia Type: Epidural Level of consciousness: awake Pain management: satisfactory to patient Vital Signs Assessment: post-procedure vital signs reviewed and stable Respiratory status: spontaneous breathing Cardiovascular status: stable Anesthetic complications: no   No notable events documented.  Last Vitals:  Vitals:   07/20/21 0529 07/20/21 0937  BP: 135/78 109/78  Pulse: 66 67  Resp: 18 18  Temp: 36.6 C 36.7 C  SpO2: 100% 98%    Last Pain:  Vitals:   07/20/21 0937  TempSrc: Oral  PainSc:    Pain Goal:                   Cephus Shelling

## 2021-07-20 NOTE — Progress Notes (Signed)
POSTPARTUM PROGRESS NOTE  Post Partum Day #1  Subjective:  No acute events overnight.  Pt denies problems with ambulating, voiding or po intake.  She denies nausea or vomiting.  Pain is well controlled.  Lochia Minimal.   Objective: Blood pressure 135/78, pulse 66, temperature 97.8 F (36.6 C), temperature source Oral, resp. rate 18, height 5\' 4"  (1.626 m), weight 89.8 kg, SpO2 100 %, unknown if currently breastfeeding.  Physical Exam:  General: alert, cooperative and no distress Lochia:normal flow Chest: CTAB Heart: RRR no m/r/g Abdomen: +BS, soft, nontender Uterine Fundus: firm, 2cm below umbilicus GU: suture intact, healing well, no purulent drainage Extremities: neg edema, neg calf TTP BL, neg Homans BL  Recent Labs    07/18/21 1730 07/20/21 0517  HGB 10.8* 9.3*  HCT 32.3* 28.3*    Assessment/Plan:  ASSESSMENT: Kayla Palmer is a 25 y.o. G1P1001 s/p SVD @ [redacted]w[redacted]d. PNC c/b bipolar d/o.   Plan for discharge tomorrow Declining circumcision Formerly on Lamictal and fluvoxamine, considering restarting, will confirm tomorrow   LOS: 2 days

## 2021-07-21 MED ORDER — MEASLES, MUMPS & RUBELLA VAC IJ SOLR
0.5000 mL | Freq: Once | INTRAMUSCULAR | Status: AC
  Administered 2021-07-21: 12:00:00 0.5 mL via SUBCUTANEOUS
  Filled 2021-07-21: qty 0.5

## 2021-07-21 MED ORDER — IBUPROFEN 800 MG PO TABS: 800.0000 mg | ORAL_TABLET | Freq: Three times a day (TID) | ORAL | 1 refills | Status: AC | PRN

## 2021-07-21 NOTE — Discharge Summary (Signed)
Postpartum Discharge Summary  Date of Service updated     Patient Name: Kayla Palmer DOB: 1996-06-10 MRN: [id number]  Date of admission: 07/18/2021 Delivery date:07/19/2021  Delivering provider: Derl Barrow E  Date of discharge: 07/21/2021  Admitting diagnosis: Post term pregnancy over 40 weeks [O48.0] Intrauterine pregnancy: [redacted]w[redacted]d     Secondary diagnosis:  Principal Problem:   Post term pregnancy over 40 weeks  Additional problems: none    Discharge diagnosis: Term Pregnancy Delivered                                              Post partum procedures: NA Augmentation: Pitocin Complications: None  Hospital course: Onset of Labor With Vaginal Delivery      25 y.o. yo G1P1001 at [redacted]w[redacted]d was admitted in Active Labor on 07/18/2021. Patient had an uncomplicated labor course as follows:  Membrane Rupture Time/Date: 7:39 AM ,07/19/2021   Delivery Method:Vaginal, Spontaneous  Episiotomy: None  Lacerations:  2nd degree  Patient had an uncomplicated postpartum course.  She is ambulating, tolerating a regular diet, passing flatus, and urinating well. Patient is discharged home in stable condition on 07/21/21.  Newborn Data: Birth date:07/19/2021  Birth time:5:38 PM  Gender:Female  Living status:Living  Apgars:8 ,9  Weight:3210 g   Physical exam  Vitals:   07/20/21 0937 07/20/21 1441 07/20/21 2048 07/21/21 0541  BP: 109/78 122/89 123/85 122/80  Pulse: 67 74 85 82  Resp: 18 18 18 18   Temp: 98 F (36.7 C) 98 F (36.7 C) 97.6 F (36.4 C) 97.7 F (36.5 C)  TempSrc: Oral Oral Oral Oral  SpO2: 98% 99% 99% 100%  Weight:      Height:       General: alert, cooperative, and no distress Lochia: appropriate Uterine Fundus: firm Incision: N/A DVT Evaluation: No evidence of DVT seen on physical exam. Negative Homan's sign. No cords or calf tenderness. Labs: Lab Results  Component Value Date   WBC 17.4 (H) 07/20/2021   HGB 9.3 (L) 07/20/2021   HCT 28.3 (L)  07/20/2021   MCV 84.0 07/20/2021   PLT 142 (L) 07/20/2021   CMP Latest Ref Rng & Units 05/01/2021  Glucose 70 - 99 mg/dL 93  BUN 6 - 20 mg/dL 7  Creatinine 05/03/2021 - 3.26 mg/dL 7.12  Sodium 4.58 - 099 mmol/L 134(L)  Potassium 3.5 - 5.1 mmol/L 3.9  Chloride 98 - 111 mmol/L 106  CO2 22 - 32 mmol/L 22  Calcium 8.9 - 10.3 mg/dL 9.0  Total Protein 6.5 - 8.1 g/dL 833)  Total Bilirubin 0.3 - 1.2 mg/dL 0.4  Alkaline Phos 38 - 126 U/L 64  AST 15 - 41 U/L 14(L)  ALT 0 - 44 U/L 8   Edinburgh Score: Edinburgh Postnatal Depression Scale Screening Tool 07/20/2021  I have been able to laugh and see the funny side of things. 0  I have looked forward with enjoyment to things. 0  I have blamed myself unnecessarily when things went wrong. 2  I have been anxious or worried for no good reason. 2  I have felt scared or panicky for no good reason. 0  Things have been getting on top of me. 0  I have been so unhappy that I have had difficulty sleeping. 0  I have felt sad or miserable. 0  I have been so unhappy that I have  been crying. 0  The thought of harming myself has occurred to me. 0  Edinburgh Postnatal Depression Scale Total 4      After visit meds:  Allergies as of 07/21/2021       Reactions   Other Other (See Comments)   Cats, grass, cherries  CHERRYS    CAT   GRASS Cats, grass, cherries  CHERRYS    CAT   GRASS   Cat Hair Extract    Cherry Itching   Grass Pollen([id number] Vern)         Medication List     TAKE these medications    Fluvoxamine Maleate 150 MG Cp24 Take 1 capsule by mouth at bedtime.   ibuprofen 800 MG tablet Commonly known as: ADVIL Take 1 tablet (800 mg total) by mouth every 8 (eight) hours as needed.   lamoTRIgine 150 MG tablet Commonly known as: LAMICTAL Take 150 mg by mouth 2 (two) times daily.         Discharge home in stable condition Infant Feeding: Breast Infant Disposition:home with mother Discharge instruction: per After Visit  Summary and Postpartum booklet. Activity: Advance as tolerated. Pelvic rest for 6 weeks.  Diet: routine diet Anticipated Birth Control: Unsure Postpartum Appointment:6 weeks Follow up Visit: [facility]      07/21/2021 Carlisle Cater, MD

## 2021-07-21 NOTE — Progress Notes (Signed)
POSTPARTUM PROGRESS NOTE  Post Partum Day #2  Subjective:  No acute events overnight.  Pt denies problems with ambulating, voiding or po intake.  She denies nausea or vomiting.  Pain is well controlled.  Lochia Minimal.   Objective: Blood pressure 122/80, pulse 82, temperature 97.7 F (36.5 C), temperature source Oral, resp. rate 18, height 5\' 4"  (1.626 m), weight 89.8 kg, SpO2 100 %, unknown if currently breastfeeding.  Physical Exam:  General: alert, cooperative and no distress Lochia:normal flow Chest: CTAB Heart: RRR no m/r/g Abdomen: +BS, soft, nontender Uterine Fundus: firm, 2cm below umbilicus GU: suture intact, healing well, no purulent drainage Extremities: neg edema, neg calf TTP BL, neg Homans BL  Recent Labs    07/18/21 1730 07/20/21 0517  HGB 10.8* 9.3*  HCT 32.3* 28.3*     Assessment/Plan:  ASSESSMENT: Kayla Palmer is a 25 y.o. G1P1001 s/p SVD @ [redacted]w[redacted]d. PNC c/b bipolar d/o.   Discharge home Declining circumcision Formerly on Lamictal and fluvoxamine - wants to hold   LOS: 3 days

## 2021-07-21 NOTE — Lactation Note (Signed)
This note was copied from a baby's chart. Lactation Consultation Note  Patient Name: Kayla Palmer PETKK'O Date: 07/21/2021 Reason for consult: 1st time breastfeeding;Follow-up assessment Age:24 hours  P1, Mother states she plans to only give baby colostrum and then formula feed. Mother has DEBP.  Suggest pumping q 3 hours. Once she decides to stop breastfeeding, discussed cabbage leaves to reduce supply.   Maternal Data  Hx PCOS  Feeding Mother's Current Feeding Choice: Breast Milk and Formula Nipple Type: Nfant Standard Flow (white)   Discharge Discharge Education: Engorgement and breast care  Consult Status Consult Status: Complete Date: 07/21/21    Dahlia Byes [facility] 07/21/2021, 12:37 PM

## 2021-07-21 NOTE — Progress Notes (Signed)
CSW met with MOB to complete consult for mental health. CSW observed MOB standing by restroom door, infant in bassinet, and FOB laying on couch. MOB gave CSW verbal consent to complete consult while FOB was present. CSW explained role, and reason for consult. MOB was pleasant, and polite during engagement with CSW. MOB reported, history of anxiety, depression, and depression from three years ago. MOB reported, history of therapy, and upcoming appointment via Telehealth at [facility] in [place], [place]. MOB reported, history of lamotrigine, and fluvoxamine. MOB reported, during her third trimester she stopped taking medication, because it made her sick. MOB reported, she plan to resume medication in the future. CSW encourage MOB to implement healthy coping skills when symptoms arises.   CSW provided education regarding the baby blues period vs. perinatal mood disorders, discussed treatment and gave resources for mental health follow up if concerns arise. CSW recommends self- evaluation during the postpartum time period using the New Mom Checklist from Postpartum Progress and encouraged MOB to contact a medical professional if symptoms are noted at any time.   MOB reported, since delivery she feels, "good". MOB reported, FOB, and her parents are very supportive. MOB denied SI, and HI when CSW assessed for safety.   MOB reported, there are no transportation barriers to follow up infant's care. MOB reported, she has all essentials needed to care for infant. MOB reported, infant has a car seat, bassinet, and crib. MOB denied any additional barriers.     CSW provided education on sudden infant death syndrome (SIDS).  CSW identifies no further need for intervention or barriers to discharge at this time.  Darcus Austin, MSW, LCSW-A Clinical Social Worker- Weekends [phone]

## 2021-08-01 ENCOUNTER — Telehealth (HOSPITAL_COMMUNITY): Payer: Self-pay | Admitting: *Deleted

## 2021-08-01 NOTE — Telephone Encounter (Signed)
Mom reports feeling good. No concerns about herself at this time. EPDS=2(Hospital score=4) Mom reports baby is doing well. Feeding, peeing, and pooping without difficulty. Safe sleep reviewed. Mom reports no concerns about baby at present.  Duffy Rhody, RN 08-01-2021 at 3:20pm

## 2022-06-28 IMAGING — CT CT ABD-PELV W/ CM
2 of 5 series · 16 of 46 positions shown, 18 images · IV contrast (omnipaque)
Comparison: None.

CLINICAL DATA: Abdominal trauma lower abdominal pain, 29 weeks
pregnant, trauma surgery rec scan

EXAM:
CT ABDOMEN AND PELVIS WITH CONTRAST
TECHNIQUE: Multidetector CT imaging of the abdomen and pelvis was performed
using the standard protocol following bolus administration of
intravenous contrast.
CONTRAST:  70mL OMNIPAQUE IOHEXOL 350 MG/ML SOLN

[Series 3: abd/ pelvis 5.0 i30f 2 · axial · 0.90mm/px · z∈[+736,+1161]mm · 13 of 95 slices shown, 15 images]
[im 5/95  soft-tissue]
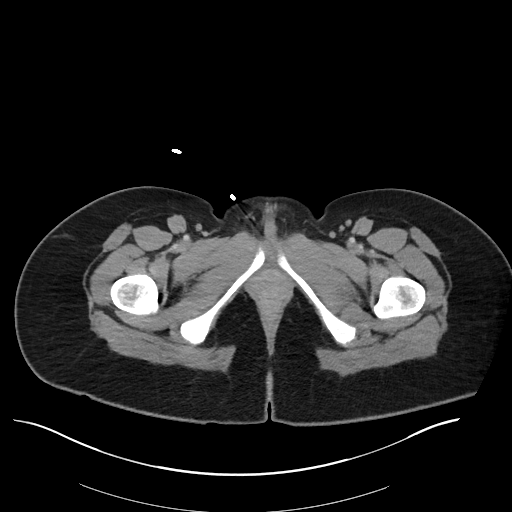
[im 5/95  bone]
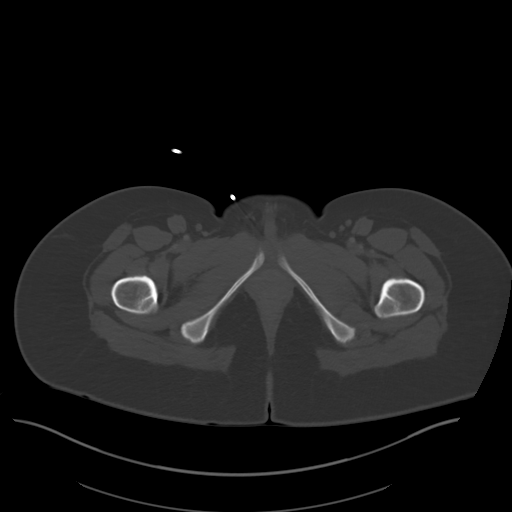
[im 15/95  soft-tissue]
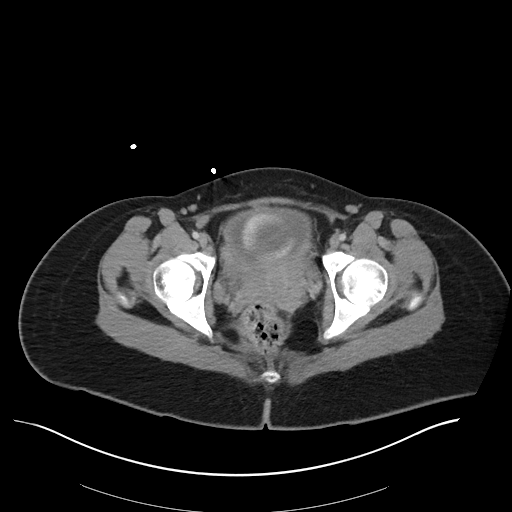
[im 19/95  soft-tissue]
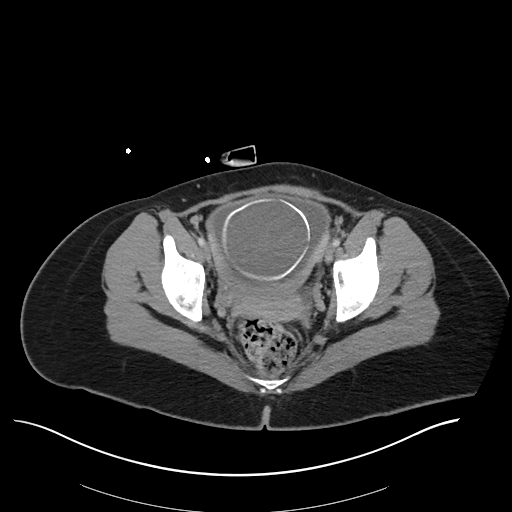
[im 29/95  soft-tissue]
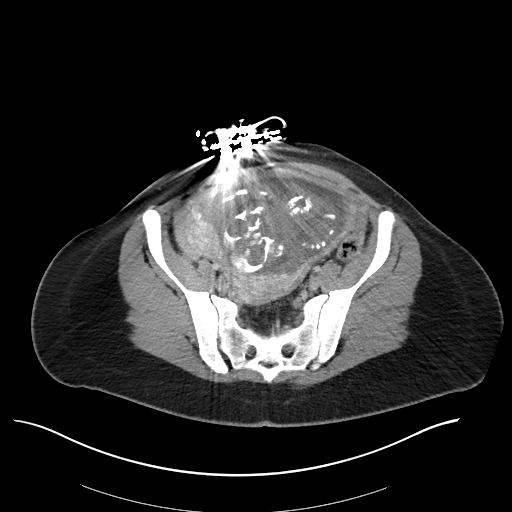
[im 33/95  soft-tissue]
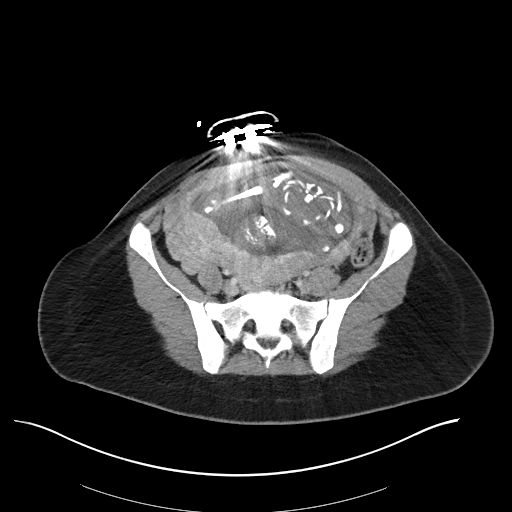
[im 43/95  soft-tissue]
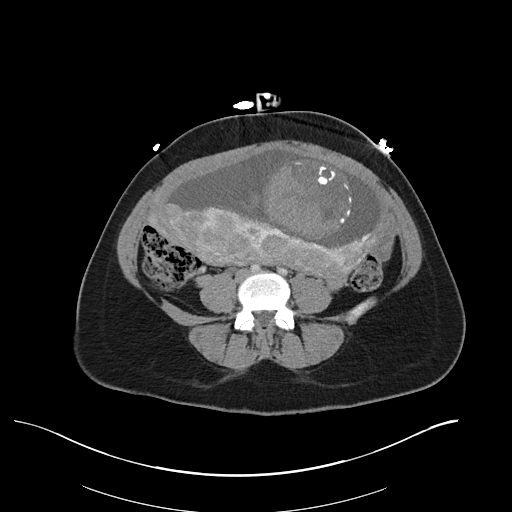
[im 48/95  soft-tissue]
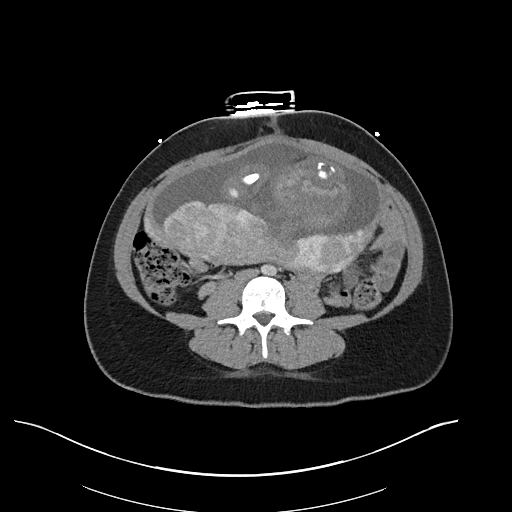
[im 52/95  soft-tissue]
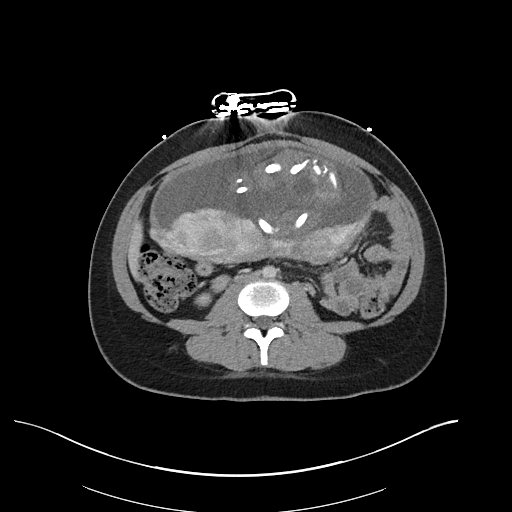
[im 62/95  soft-tissue]
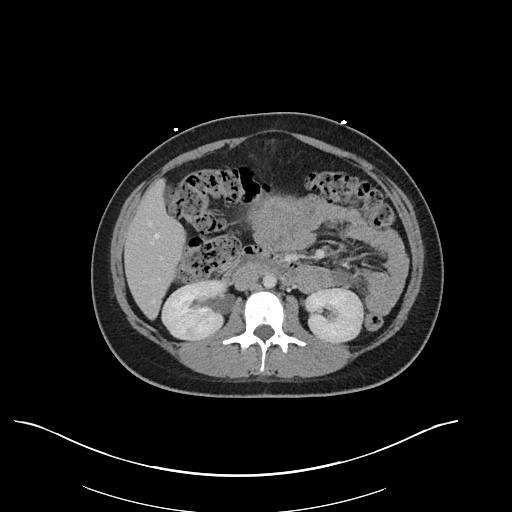
[im 62/95  bone]
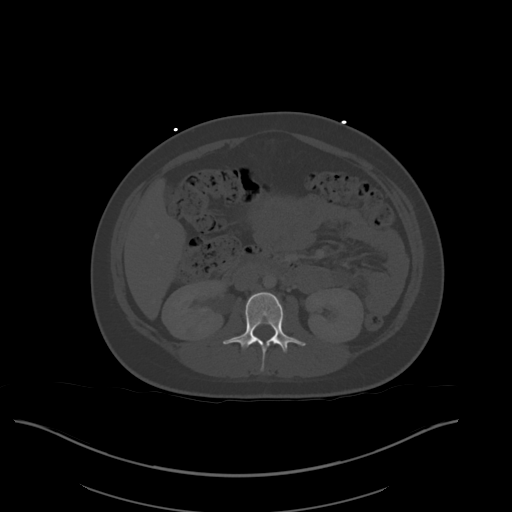
[im 66/95  soft-tissue]
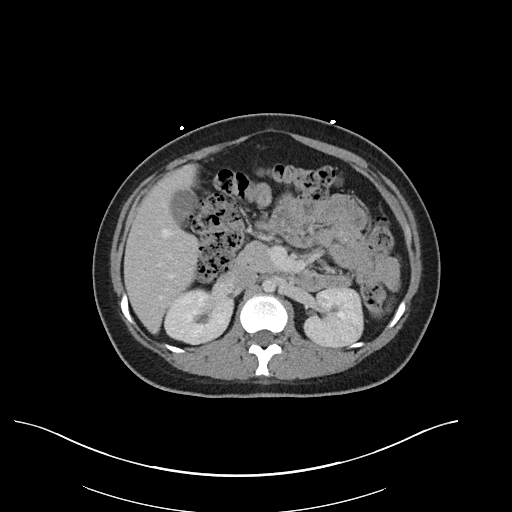
[im 76/95  soft-tissue]
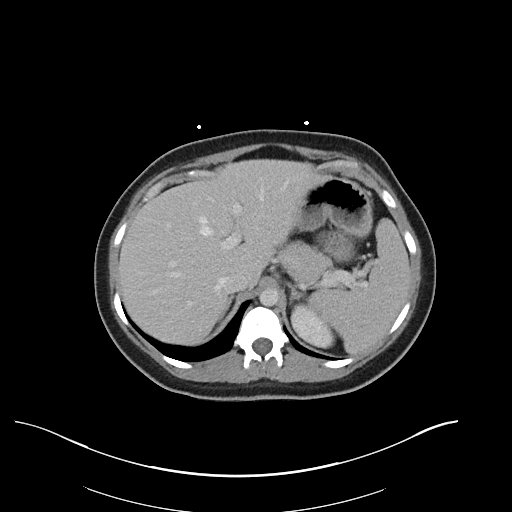
[im 80/95  soft-tissue]
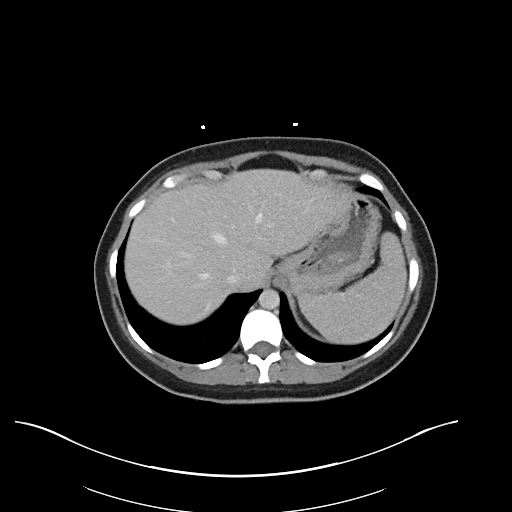
[im 90/95  soft-tissue]
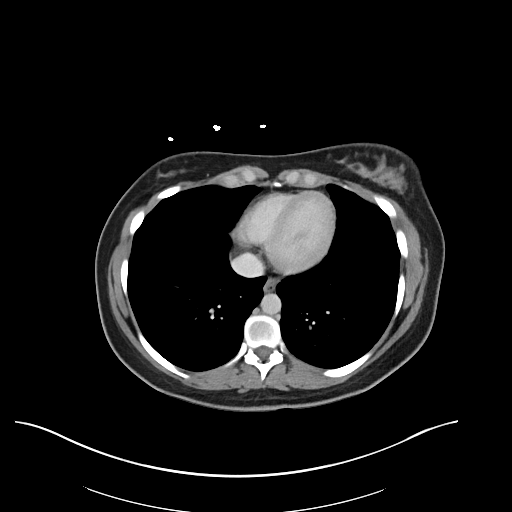

[Series 6: coronal soft tissue · coronal · 0.81mm/px · 3 of 101 slices shown]
[im 34/101  soft-tissue]
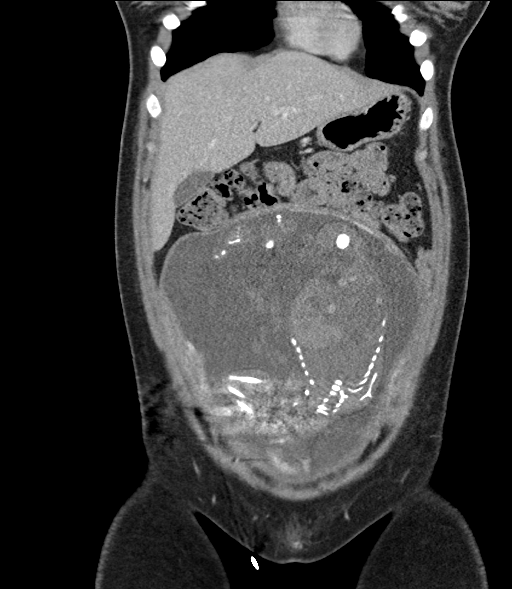
[im 45/101  soft-tissue]
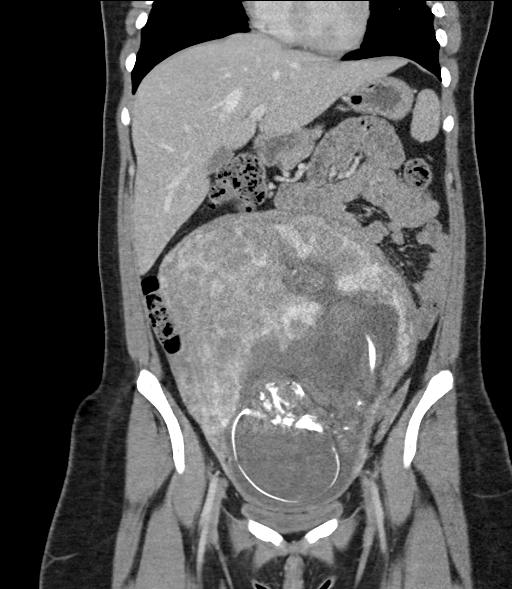
[im 56/101  soft-tissue]
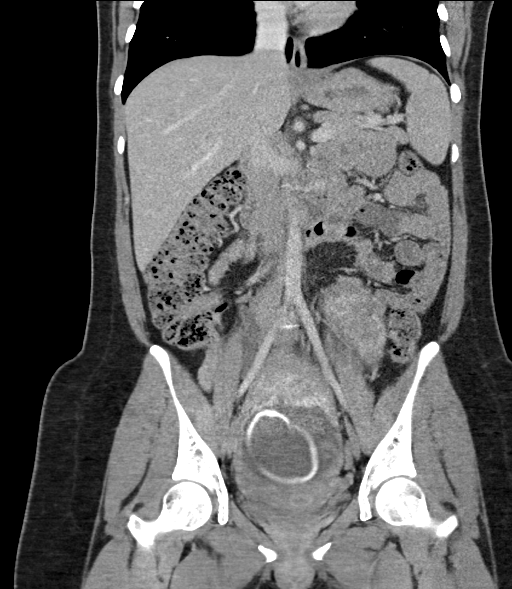

[16 of 46 positions shown; findings below may reference images not displayed]

FINDINGS: Visualized lower thorax: No acute abnormality.

Liver: Not enlarged. No focal lesion. No laceration or subcapsular
hematoma.

Biliary System: The gallbladder is otherwise unremarkable with no
radio-opaque gallstones. No biliary ductal dilatation.

Pancreas: Normal pancreatic contour. No main pancreatic duct
dilatation.

Spleen: Not enlarged. No focal lesion. No laceration, subcapsular
hematoma, or vascular injury.

Adrenal Glands: No nodularity bilaterally.

Kidneys:

Bilateral kidneys enhance symmetrically. 3 mm calcified stone within
the left kidney. No hydronephrosis. No contusion, laceration, or
subcapsular hematoma.

No injury to the vascular structures or collecting systems. No
hydroureter.

The urinary bladder is unremarkable.

Bowel: No small or large bowel wall thickening or dilatation. The
appendix is unremarkable.

Mesentery, Omentum, and Peritoneum: No simple free fluid ascites. No
pneumoperitoneum. No hemoperitoneum. No mesenteric hematoma
identified. No organized fluid collection.

Pelvic Organs: Intrauterine pregnancy consistent with known third
trimester gestation. Please note markedly limited evaluation.
Posterior placenta. Bilateral adnexa are unremarkable.

Lymph Nodes: No abdominal, pelvic, inguinal lymphadenopathy.

Vasculature: No abdominal aorta or iliac aneurysm. No active
contrast extravasation or pseudoaneurysm.

Musculoskeletal:

No significant soft tissue hematoma. Subcutaneus soft tissue edema
surrounding the umbilicus.

No acute pelvic fracture. No spinal fracture.
IMPRESSION: 1.  No acute traumatic injury to the chest, abdomen, or pelvis.

2. No acute fracture or traumatic malalignment of the thoracic or
lumbar spine.
3. Intrauterine pregnancy consistent with known third trimester
gestation. Please note markedly limited evaluation.
4. Nonobstructive 3 mm left nephrolithiasis.
5. Subcutaneus soft tissue edema surrounding the umbilicus.

These results were called by telephone at the time of interpretation
on 05/01/2021 at [DATE] to provider Dr. Daleth who verbally
acknowledged these results.
